# Patient Record
Sex: Female | Born: 1975 | Hispanic: Yes | Marital: Married | Smoking: Never smoker
Health system: Southern US, Community
[De-identification: ages and names within clinical notes are randomized; demographics above are authoritative.]

## PROBLEM LIST (undated history)

## (undated) ENCOUNTER — Inpatient Hospital Stay (HOSPITAL_COMMUNITY): Payer: Self-pay

## (undated) DIAGNOSIS — R51 Headache: Secondary | ICD-10-CM

## (undated) DIAGNOSIS — R519 Headache, unspecified: Secondary | ICD-10-CM

## (undated) DIAGNOSIS — O24419 Gestational diabetes mellitus in pregnancy, unspecified control: Secondary | ICD-10-CM

## (undated) DIAGNOSIS — N12 Tubulo-interstitial nephritis, not specified as acute or chronic: Secondary | ICD-10-CM

## (undated) HISTORY — PX: CHOLECYSTECTOMY: SHX55

## (undated) HISTORY — DX: Tubulo-interstitial nephritis, not specified as acute or chronic: N12

---

## 1994-07-18 DIAGNOSIS — N12 Tubulo-interstitial nephritis, not specified as acute or chronic: Secondary | ICD-10-CM

## 1994-07-18 HISTORY — DX: Tubulo-interstitial nephritis, not specified as acute or chronic: N12

## 2008-11-26 ENCOUNTER — Emergency Department (HOSPITAL_COMMUNITY): Admission: EM | Admit: 2008-11-26 | Discharge: 2008-11-26 | Payer: Self-pay | Admitting: Emergency Medicine

## 2010-10-26 LAB — DIFFERENTIAL
Basophils Absolute: 0 10*3/uL (ref 0.0–0.1)
Basophils Absolute: 0 10*3/uL (ref 0.0–0.1)
Basophils Relative: 0 % (ref 0–1)
Basophils Relative: 0 % (ref 0–1)
Eosinophils Absolute: 0.2 10*3/uL (ref 0.0–0.7)
Eosinophils Absolute: 0.2 10*3/uL (ref 0.0–0.7)
Eosinophils Relative: 3 % (ref 0–5)
Eosinophils Relative: 4 % (ref 0–5)
Lymphocytes Relative: 17 % (ref 12–46)
Lymphocytes Relative: 24 % (ref 12–46)
Lymphs Abs: 1 10*3/uL (ref 0.7–4.0)
Lymphs Abs: 1.2 10*3/uL (ref 0.7–4.0)
Monocytes Absolute: 0.4 10*3/uL (ref 0.1–1.0)
Monocytes Absolute: 0.6 10*3/uL (ref 0.1–1.0)
Monocytes Relative: 11 % (ref 3–12)
Monocytes Relative: 7 % (ref 3–12)
Neutro Abs: 3.2 10*3/uL (ref 1.7–7.7)
Neutro Abs: 4 10*3/uL (ref 1.7–7.7)
Neutrophils Relative %: 61 % (ref 43–77)
Neutrophils Relative %: 73 % (ref 43–77)

## 2010-10-26 LAB — CBC
HCT: 27.7 % — ABNORMAL LOW (ref 36.0–46.0)
HCT: 28.9 % — ABNORMAL LOW (ref 36.0–46.0)
Hemoglobin: 9.1 g/dL — ABNORMAL LOW (ref 12.0–15.0)
Hemoglobin: 9.2 g/dL — ABNORMAL LOW (ref 12.0–15.0)
MCHC: 31.9 g/dL (ref 30.0–36.0)
MCHC: 33 g/dL (ref 30.0–36.0)
MCV: 65.9 fL — ABNORMAL LOW (ref 78.0–100.0)
MCV: 66.3 fL — ABNORMAL LOW (ref 78.0–100.0)
Platelets: 211 10*3/uL (ref 150–400)
Platelets: 220 10*3/uL (ref 150–400)
RBC: 4.2 MIL/uL (ref 3.87–5.11)
RBC: 4.37 MIL/uL (ref 3.87–5.11)
RDW: 19.5 % — ABNORMAL HIGH (ref 11.5–15.5)
RDW: 19.5 % — ABNORMAL HIGH (ref 11.5–15.5)
WBC: 5.2 10*3/uL (ref 4.0–10.5)
WBC: 5.6 10*3/uL (ref 4.0–10.5)

## 2010-10-26 LAB — POCT I-STAT, CHEM 8
BUN: 13 mg/dL (ref 6–23)
Calcium, Ion: 1.15 mmol/L (ref 1.12–1.32)
Chloride: 105 mEq/L (ref 96–112)
Creatinine, Ser: 0.7 mg/dL (ref 0.4–1.2)
Glucose, Bld: 100 mg/dL — ABNORMAL HIGH (ref 70–99)
HCT: 31 % — ABNORMAL LOW (ref 36.0–46.0)
Hemoglobin: 10.5 g/dL — ABNORMAL LOW (ref 12.0–15.0)
Potassium: 4.1 mEq/L (ref 3.5–5.1)
Sodium: 137 mEq/L (ref 135–145)
TCO2: 23 mmol/L (ref 0–100)

## 2010-10-26 LAB — URINALYSIS, ROUTINE W REFLEX MICROSCOPIC
Bilirubin Urine: NEGATIVE
Glucose, UA: NEGATIVE mg/dL
Hgb urine dipstick: NEGATIVE
Ketones, ur: NEGATIVE mg/dL
Nitrite: NEGATIVE
Protein, ur: NEGATIVE mg/dL
Specific Gravity, Urine: 1.026 (ref 1.005–1.030)
Urobilinogen, UA: 0.2 mg/dL (ref 0.0–1.0)
pH: 5.5 (ref 5.0–8.0)

## 2010-10-26 LAB — GC/CHLAMYDIA PROBE AMP, GENITAL
Chlamydia, DNA Probe: NEGATIVE
GC Probe Amp, Genital: NEGATIVE

## 2010-10-26 LAB — POCT PREGNANCY, URINE: Preg Test, Ur: NEGATIVE

## 2010-10-26 LAB — WET PREP, GENITAL
Clue Cells Wet Prep HPF POC: NONE SEEN
Trich, Wet Prep: NONE SEEN
Yeast Wet Prep HPF POC: NONE SEEN

## 2010-10-26 LAB — RPR: RPR Ser Ql: NONREACTIVE

## 2011-07-19 DIAGNOSIS — O24419 Gestational diabetes mellitus in pregnancy, unspecified control: Secondary | ICD-10-CM

## 2011-07-19 HISTORY — DX: Gestational diabetes mellitus in pregnancy, unspecified control: O24.419

## 2012-01-24 ENCOUNTER — Ambulatory Visit (INDEPENDENT_AMBULATORY_CARE_PROVIDER_SITE_OTHER): Payer: Self-pay | Admitting: Family Medicine

## 2012-01-24 ENCOUNTER — Encounter: Payer: Self-pay | Admitting: Family Medicine

## 2012-01-24 VITALS — BP 118/76 | HR 69 | Ht 63.0 in | Wt 156.0 lb

## 2012-01-24 DIAGNOSIS — IMO0001 Reserved for inherently not codable concepts without codable children: Secondary | ICD-10-CM

## 2012-01-24 DIAGNOSIS — Z309 Encounter for contraceptive management, unspecified: Secondary | ICD-10-CM

## 2012-01-24 LAB — POCT URINE PREGNANCY: Preg Test, Ur: NEGATIVE

## 2012-01-24 MED ORDER — NORGESTIMATE-ETH ESTRADIOL 0.25-35 MG-MCG PO TABS: 1.0000 | ORAL_TABLET | Freq: Every day | ORAL | Status: DC

## 2012-01-24 NOTE — Progress Notes (Signed)
S: Pt comes in today for new patient visit.  Will be applying for orange card.  Only concern for today is having IUD removed-- was place din 2008, has been having some difficulties with it for the past few months.  Would like to change to OCPs for birth control.  Wanda Powers snot been on them in many years and does not have a preference for which one to start.  Non-smoker, no migraine headaches, no blood pressure issues, no history of blood clots.    ROS: Per HPI  Past medical, surgical, social, and family histories reviewed and updates as above.  History  Smoking status  . Never Smoker   Smokeless tobacco  . Not on file    O:  Filed Vitals:   01/24/12 1105  BP: 118/76  Pulse: 69    Gen: NAD Pelvic: cervix normal in appearance without erythema or irritation, no vaginal discharge or bleeding, no external lesions; IUD strings easily identified.    A/P: 36 y.o. female p/w New patient, IUD removal -See problem list -f/u with Dr. Mauricio Po once meets with Rudell Cobb.     PROCEDURE NOTE: IUD REMOVAL Speculum was inserted in the vagina, cervix was well visualized.  Strings were easily identifiable protruding from cervix.  Strings were grasps with long ringed forceps.  IUD was removed in its entirety.  Patient tolerated the procedure well without pain or bleeding.  Patient was counseled on using different form of birth control.

## 2012-01-24 NOTE — Patient Instructions (Signed)
It was nice to meet you today.  We removed your IUD.  I am giving you a prescription for birth control pills.  They are $7 at [facility]!  Come back and see Dr. Mauricio Po once you have applied for the orange card.

## 2012-01-24 NOTE — Assessment & Plan Note (Signed)
IUD removed without difficulty.  OCPs stated per pt request-- since no preference, will start Sprintec due to low cost.

## 2012-02-21 ENCOUNTER — Ambulatory Visit: Payer: Self-pay | Admitting: Family Medicine

## 2012-02-24 ENCOUNTER — Encounter: Payer: Self-pay | Admitting: Family Medicine

## 2012-02-24 ENCOUNTER — Ambulatory Visit (INDEPENDENT_AMBULATORY_CARE_PROVIDER_SITE_OTHER): Payer: Self-pay | Admitting: Family Medicine

## 2012-02-24 VITALS — BP 105/70 | HR 66 | Ht 63.0 in | Wt 158.0 lb

## 2012-02-24 DIAGNOSIS — M7710 Lateral epicondylitis, unspecified elbow: Secondary | ICD-10-CM

## 2012-02-24 DIAGNOSIS — Z309 Encounter for contraceptive management, unspecified: Secondary | ICD-10-CM

## 2012-02-24 DIAGNOSIS — M7711 Lateral epicondylitis, right elbow: Secondary | ICD-10-CM

## 2012-02-24 MED ORDER — NAPROXEN 500 MG PO TABS: 500.0000 mg | ORAL_TABLET | Freq: Two times a day (BID) | ORAL | Status: DC

## 2012-02-24 NOTE — Progress Notes (Signed)
  Subjective:    Patient ID: Wanda Powers, female    DOB: 01-Aug-1975, 36 y.o.   MRN: [id number]  HPI Visit conducted in Spanish.   Patient with complaint of R elbow pain; point tenderness over the R lateral epicondyle.  Has been ongoing for the past several (@6 ) months. She is right-handed. Does not identify any changes in her activity that would have triggered this.  No history of trauma to the right elbow.   Has not taken anything to help with the pain.     Review of Systems     Objective:   Physical Exam Well appearing, no apparent distress HEENT Neck supple. MSK: R elbow full active and passive ROM; point tenderness over R lateral epicondyle. No redness or warmth, no edema. Sensation in fingers full bilaterally. Palpable radial pulses bilat. Negative tinnels and phalens signs bilat.  Full active/passive ROM shoulders bilaterally.        Assessment & Plan:

## 2012-02-24 NOTE — Assessment & Plan Note (Signed)
Taking the Sprintec that was prescribed at last visit with IUD removal. Tolerating well. To continue.

## 2012-02-24 NOTE — Patient Instructions (Addendum)
Fue un Research officer, trade union.   Para el dolor del codo derecho, recomiendo que lo envuelva en una toalla para que no se doble cuando duerme, cada noche. De dia, quiero que use un cinto que se llama de TENNIS ELBOW STRAP.  Se puede comprar sin receta en la farmacia.  Tome la Naproxen 500mg , una tableta por boca, 2 veces por dia, con algo de comer.  Despues de 15 dias, tomesela nada mas cuando lo necesite para el dolor.

## 2012-02-24 NOTE — Assessment & Plan Note (Signed)
Discussed limitation of possible exacerbating activity.  Use bath towel around R elbow when sleeping.  Use of tennis elbow strap during the day. NSAIDs for the coming 2 weeks with Naproxen 500mg  twice daily with food.  Follow up if not better in the coming 6 weeks.

## 2012-04-03 ENCOUNTER — Ambulatory Visit (INDEPENDENT_AMBULATORY_CARE_PROVIDER_SITE_OTHER): Payer: Self-pay | Admitting: Family Medicine

## 2012-04-03 ENCOUNTER — Ambulatory Visit (HOSPITAL_COMMUNITY)
Admission: RE | Admit: 2012-04-03 | Discharge: 2012-04-03 | Disposition: A | Discharge status: Home / Self Care | Payer: Self-pay | Source: Ambulatory Visit | Attending: Family Medicine | Admitting: Family Medicine

## 2012-04-03 ENCOUNTER — Encounter: Payer: Self-pay | Admitting: Family Medicine

## 2012-04-03 VITALS — BP 107/71 | HR 92 | Temp 98.4°F | Ht 63.0 in | Wt 163.0 lb

## 2012-04-03 DIAGNOSIS — M7710 Lateral epicondylitis, unspecified elbow: Secondary | ICD-10-CM | POA: Insufficient documentation

## 2012-04-03 DIAGNOSIS — L0201 Cutaneous abscess of face: Secondary | ICD-10-CM

## 2012-04-03 DIAGNOSIS — M7711 Lateral epicondylitis, right elbow: Secondary | ICD-10-CM

## 2012-04-03 DIAGNOSIS — L03211 Cellulitis of face: Secondary | ICD-10-CM | POA: Insufficient documentation

## 2012-04-03 MED ORDER — MUPIROCIN 2 % EX OINT: TOPICAL_OINTMENT | Freq: Two times a day (BID) | CUTANEOUS | Status: DC

## 2012-04-03 MED ORDER — NAPROXEN 500 MG PO TABS: 500.0000 mg | ORAL_TABLET | Freq: Two times a day (BID) | ORAL | Status: DC

## 2012-04-03 NOTE — Patient Instructions (Addendum)
Fue un Research officer, trade union.  Creo que el dolor del codo derecho se debe a una condicion que se llama de LATERAL EPICONDYLITIS; es un proceso inflamatorio.  Ademas de las cosas que ha estado haciendo para esto, recomiendo que use un cinto que se llama de "TENNIS ELBOW STRAP", que se aplica alrededor del antebrazo un poco debajo del codo derecho.  Trate de evitar actividades que requieren de mucho esfuerzo para levantar cosas pesadas con el brazo derecho.  Voy a Research scientist (physical sciences) radiografia del codo derecho, para excluir otros procesos que pudieran Software engineer.   Quiero verle en 4 semanas si no esta' mejor.   PATIENT'S MOTHER, MARIA DE [place] HERNANDEZ, TO BE REGISTERED FOR DR Mauricio Po.  PLEASE GIVE MOTHER AN APPOINTMENT WITH DR Mauricio Po ON 9/20.

## 2012-04-04 ENCOUNTER — Encounter: Payer: Self-pay | Admitting: Family Medicine

## 2012-04-04 NOTE — Assessment & Plan Note (Signed)
Very mild erythema around the nasal alae bilaterally, spreading slightly above philtrum. For topical mupirocin use twice daily until it goes away.

## 2012-04-04 NOTE — Progress Notes (Signed)
  Subjective:    Patient ID: Wanda Powers, female    DOB: 11-18-1975, 36 y.o.   MRN: [id number]  HPI Patient visit conducted in Spanish.  She presents today with her mother, Jackelyn Knife [place], who recently arrived from [place], [place], and would like to establish as a patient here.   Teirra reports that her R elbow continues to bother her, especially when she lifts something heavy.  No trauma that she can recall.  When I ask about Naproxen, she states she is not taking because she ran out.  Also, has a "blue" sleeve that she puts on above the level of the right elbow.  Has been using the bath towel at night to present prolonged periods of flexion.  She does housework but is not working outside the home.  She does not drive or rest her R elbow in stationary position for prolonged time.  She describes some radiating discomfort into the R 4 and 5th digits at times.  The lateral epicondylar pain radiates proximally at times, and she has some difficulty raising her R shoulder because of it.  She is right-handed.     Review of SystemsSee above     Objective:   Physical Exam Well appearing, no apparent distress HEENT Neck supple. Full active ROM in all planes. Raw and erythematous appearance of skin around both nasal allae, extending to superior margin of philtrum. MSK: Small amount tenderness over AC joint on Right.  FUll active ROM in R shoulder includilng flexion/extension, int/ext rotation, ab/adduction. Handgrip intact and full bilat, symmetric. Radial pulses present and symmetric bilaterally. Sensation grossly intact bilaterally in fingertips.  Point tenderness over the R lateral epicondyle; no tenderness when pressing on the ulnar groove on the Right.        Assessment & Plan:

## 2012-04-04 NOTE — Assessment & Plan Note (Signed)
Continues to describe pain over lateral epicondyle of R arm; she is right-handed.  Has not been using Naproxen regularly (ran out), and has been using what sounds like blue neopryne sleeve proximal to the R elbow.  Discussed use of tennis elbow strap distal to the elbow; will get xrays to rule out bony fragments or other occult injury.  She does not report any notable trauma or accidents, but she states that she cannot exclude the possibility of some subtle injury in the past.  To consider injection of epicondylitis if xray negative and if pain continues despite NSAID and tennis elbow strap usage.

## 2012-04-17 ENCOUNTER — Ambulatory Visit: Payer: Self-pay | Admitting: Family Medicine

## 2012-04-24 ENCOUNTER — Ambulatory Visit (INDEPENDENT_AMBULATORY_CARE_PROVIDER_SITE_OTHER): Payer: Self-pay | Admitting: Family Medicine

## 2012-04-24 ENCOUNTER — Encounter: Payer: Self-pay | Admitting: Family Medicine

## 2012-04-24 VITALS — BP 118/82 | HR 69 | Ht 63.0 in | Wt 161.9 lb

## 2012-04-24 DIAGNOSIS — M7711 Lateral epicondylitis, right elbow: Secondary | ICD-10-CM

## 2012-04-24 DIAGNOSIS — Z3201 Encounter for pregnancy test, result positive: Secondary | ICD-10-CM

## 2012-04-24 DIAGNOSIS — Z309 Encounter for contraceptive management, unspecified: Secondary | ICD-10-CM

## 2012-04-24 DIAGNOSIS — Z3480 Encounter for supervision of other normal pregnancy, unspecified trimester: Secondary | ICD-10-CM

## 2012-04-24 DIAGNOSIS — M7710 Lateral epicondylitis, unspecified elbow: Secondary | ICD-10-CM

## 2012-04-24 LAB — POCT URINE PREGNANCY: Preg Test, Ur: POSITIVE

## 2012-04-24 MED ORDER — NITROGLYCERIN 0.1 MG/HR TD PT24: 1.0000 | MEDICATED_PATCH | Freq: Every day | TRANSDERMAL | Status: DC

## 2012-04-24 NOTE — Patient Instructions (Addendum)
Fue un Research officer, trade union.   Para el dolor del codo derecho, si no esta' mejor, quiero que vea a los medicos de Sports Medicine (medicina de deporte/musculoesqueletal).  La prueba de embarazo salio' positiva hoy.  Puede pasar en los casos cuando se inician las pastillas anticonceptivas.  Le recomiendo que comience a tomar las vitaminas prenatales una diario.  Por la ultima menstruacion tiene ahora 5 semanas con 4 dias, con una fecha de parto anticipado de 06 de junio de 2014.  Le estoy remitiendole al programa [facility] [address] de [place].

## 2012-04-25 NOTE — Assessment & Plan Note (Signed)
By LMP, patient's dating is [redacted]w[redacted]d today.  She is here with her mother; she was not planning to have another child at this time; states clearly that she would not interrupt or abort the pregnancy.  Is given contact information for AAM program, and instructed in Prenatal vitamin use.  Will enroll for prenatal care in our office once she qualifies for AAM.  She understands this protocol.

## 2012-04-25 NOTE — Assessment & Plan Note (Signed)
Patient with lateral epicondylitis, Right elbow.  I had planned and discussed use of NTG patch q24 hours, however in light of her pregnancy, we will not pursue this plan.  Patient is to make appt with [facility] for consideration [place] and possible injection at the discretion of the [facility] doctors.  No NSAIDs.   I called patient after visit and on Oct 09 to clarify and reiterate these points; left 2 voice mails for her to call me to discuss.

## 2012-04-25 NOTE — Progress Notes (Signed)
  Subjective:    Patient ID: Wanda Powers, female    DOB: June 10, 1976, 36 y.o.   MRN: [id number]  HPI Visit in Spanish.  Kordelia reports that her R elbow pain is continuing, despite use of NSAIDs and tennis elbow brace.  Worse when she lifts things.  Is becoming frustrated.   Reviewed history; no acute trauma preceded this episode of injury/pain.    Of note, her LMP is Mar 16, 2012.  She had IUD removed in early July in this office (see office notes); was using OCPs before the removal of the Mirena IUD (which she had originally placed 10 yrs ago, replaced 5 yrs ago).  Has been taking OCPs daily since the removal of the IUD.  She and husband had talked about having another child in the future, but were not planning for it now.    Review of SystemsSee above. She denies nausea/vomiting or breast tenderness.      Objective:   Physical Exam Well appearing, no apparent distress.  ELBOW, RIGHT with point tenderness over lateral epicondyle. Full handgrip and ROM (Active) in R elbow.       Assessment & Plan:

## 2012-04-26 ENCOUNTER — Telehealth: Payer: Self-pay | Admitting: Family Medicine

## 2012-04-26 NOTE — Telephone Encounter (Signed)
Call completed to patient, to let her know that I do not recommend using the NTG patch for her elbow epicondylitis while pregnant.  I had called the pharmacy before the med was dispensed and I cancelled the Rx, so she did not receive the medicine.  I am asking our staff ([facility]) to call patient to schedule a Sports Medicine appt, to consider injection or [place] of epicondylitis in this pregnant patient.   I gave her the contact info (address and phone #) of [facility] for registration with them and eventual assignment to our practice for prenatal care.  [name], MD

## 2012-04-26 NOTE — Telephone Encounter (Signed)
Pt is returning call to Dr Mauricio Po - she will be available this afternoon

## 2012-04-26 NOTE — Telephone Encounter (Signed)
Forward to Dr Mauricio Po, I don't see any phone note in the chart?

## 2012-05-08 ENCOUNTER — Telehealth: Payer: Self-pay | Admitting: Family Medicine

## 2012-05-08 NOTE — Telephone Encounter (Signed)
Patient is calling to find out what is a good medication to take for a cough and cold while pregnant.

## 2012-05-08 NOTE — Telephone Encounter (Signed)
Called pt. Advised to take Robitussin DM, Sudafed for congestion and also to ask the pharmacist for advise re: safe cough meds during pregnancy. Pt agreed. Lorenda Hatchet, Renato Battles

## 2012-05-09 NOTE — Telephone Encounter (Signed)
I called patient, call in Spanish.  She is feeling a bit better from the cold, taking teas, no otc medicines.  I called to let her know that Sudafed is not recommended early in pregnancy (she is in 1st trimester).  She has appt with [facility] tomorrow.  [name], MD

## 2012-05-22 ENCOUNTER — Other Ambulatory Visit: Payer: Self-pay

## 2012-05-22 DIAGNOSIS — Z3480 Encounter for supervision of other normal pregnancy, unspecified trimester: Secondary | ICD-10-CM

## 2012-05-22 LAB — HIV ANTIBODY (ROUTINE TESTING W REFLEX): HIV: NONREACTIVE

## 2012-05-22 NOTE — Progress Notes (Signed)
PRENATAL LABS DONE TODAY Wanda Powers 

## 2012-05-23 LAB — OBSTETRIC PANEL
Antibody Screen: NEGATIVE
Basophils Absolute: 0 10*3/uL (ref 0.0–0.1)
Basophils Relative: 0 % (ref 0–1)
Eosinophils Absolute: 0.1 10*3/uL (ref 0.0–0.7)
Eosinophils Relative: 3 % (ref 0–5)
HCT: 31.2 % — ABNORMAL LOW (ref 36.0–46.0)
Hemoglobin: 10 g/dL — ABNORMAL LOW (ref 12.0–15.0)
Hepatitis B Surface Ag: NEGATIVE
Lymphocytes Relative: 23 % (ref 12–46)
Lymphs Abs: 1.3 10*3/uL (ref 0.7–4.0)
MCH: 22.1 pg — ABNORMAL LOW (ref 26.0–34.0)
MCHC: 32.1 g/dL (ref 30.0–36.0)
MCV: 68.9 fL — ABNORMAL LOW (ref 78.0–100.0)
Monocytes Absolute: 0.5 10*3/uL (ref 0.1–1.0)
Monocytes Relative: 8 % (ref 3–12)
Neutro Abs: 3.6 10*3/uL (ref 1.7–7.7)
Neutrophils Relative %: 66 % (ref 43–77)
Platelets: 262 10*3/uL (ref 150–400)
RBC: 4.53 MIL/uL (ref 3.87–5.11)
RDW: 20.7 % — ABNORMAL HIGH (ref 11.5–15.5)
Rh Type: POSITIVE
Rubella: 17.5 IU/mL — ABNORMAL HIGH
WBC: 5.5 10*3/uL (ref 4.0–10.5)

## 2012-05-23 LAB — SICKLE CELL SCREEN: Sickle Cell Screen: NEGATIVE

## 2012-05-24 ENCOUNTER — Telehealth: Payer: Self-pay | Admitting: Family Medicine

## 2012-05-24 ENCOUNTER — Encounter: Payer: Self-pay | Admitting: Family Medicine

## 2012-05-24 ENCOUNTER — Ambulatory Visit (INDEPENDENT_AMBULATORY_CARE_PROVIDER_SITE_OTHER): Payer: Self-pay | Admitting: Family Medicine

## 2012-05-24 ENCOUNTER — Other Ambulatory Visit: Payer: Self-pay | Admitting: Family Medicine

## 2012-05-24 VITALS — BP 125/77 | HR 91 | Ht 63.0 in | Wt 169.0 lb

## 2012-05-24 DIAGNOSIS — M7711 Lateral epicondylitis, right elbow: Secondary | ICD-10-CM

## 2012-05-24 DIAGNOSIS — M7710 Lateral epicondylitis, unspecified elbow: Secondary | ICD-10-CM

## 2012-05-24 LAB — CULTURE, OB URINE
Colony Count: NO GROWTH
Organism ID, Bacteria: NO GROWTH

## 2012-05-24 MED ORDER — FERROUS SULFATE 325 (65 FE) MG PO TABS: 325.0000 mg | ORAL_TABLET | Freq: Every day | ORAL | Status: DC

## 2012-05-24 MED ORDER — MUPIROCIN CALCIUM 2 % EX CREA: TOPICAL_CREAM | Freq: Three times a day (TID) | CUTANEOUS | Status: DC

## 2012-05-24 MED ORDER — FLUTICASONE PROPIONATE 50 MCG/ACT NA SUSP: 2.0000 | Freq: Every day | NASAL | Status: DC

## 2012-05-24 NOTE — Patient Instructions (Addendum)
You have lateral epicondylitis (tennis elbow). Try to avoid painful activities as much as possible. Ice the area 3-4 times a day for 15 minutes at a time. Tylenol as needed for pain. Counterforce brace as directed can help unload area - wear this regularly if it provides you with relief. Start physical therapy and do home exercises they give you on days you do not go there. Home Pronation/supination with 1 pound weight, wrist extension, stretching - do these once a day. At follow-up will consider injection if the above is not helping. Surgery is a consideration if exercises and injection are not providing enough relief. Follow up with me in 5-6 weeks.

## 2012-05-24 NOTE — Telephone Encounter (Signed)
Called Wanda Powers regarding her prenatal labs, she has Hgb 10.  I am recommending her to take FeSO4 325 one time daily.  She is taking PNVs now.   Also, she states the mupirocin that I prescribed in Sept for suspected early soft tissue infection around nasal alae was too expensive (over $80).  Will send to [facility] MAP program by fax.  She reports some continued dry cough with throat itch, without sputum production.  Consider nasal saline and nasal steroid spray to see if this helps.  Will send along to MAP as well. Discussed with her the reason for her being scheduled with another physician (Dr. Clinton Sawyer) for her OB visits.  She voices understanding of this.  [name], MD

## 2012-05-24 NOTE — Progress Notes (Signed)
  Subjective:    Patient ID: Wanda Powers, female    DOB: Aug 06, 1975, 36 y.o.   MRN: [id number]  PCP: Dr. Mauricio Po  HPI 36 yo F here for right elbow pain.  Patient denies known injury. States about 10 months ago started to develop lateral elbow pain. No swelling or bruising. Radiates into forearm. Worse with wrist motions. No numbness or tingling. Tried naproxen, elbow brace without much help. Not taking any medicine for this now. Has not done rehab or had injection.  History reviewed. No pertinent past medical history.  Current Outpatient Prescriptions on File Prior to Visit  Medication Sig Dispense Refill  . ferrous sulfate 325 (65 FE) MG tablet Take 1 tablet (325 mg total) by mouth daily with breakfast.  90 tablet  3  . fluticasone (FLONASE) 50 MCG/ACT nasal spray Place 2 sprays into the nose daily.  16 g  2  . mupirocin cream (BACTROBAN) 2 % Apply topically 3 (three) times daily.  15 g  0  . [DISCONTINUED] ferrous sulfate 325 (65 FE) MG tablet Take 1 tablet (325 mg total) by mouth daily with breakfast.  90 tablet  3  . [DISCONTINUED] fluticasone (FLONASE) 50 MCG/ACT nasal spray Place 2 sprays into the nose daily.  16 g  2    Past Surgical History  Procedure Date  . Cesarean section     No Known Allergies  History   Social History  . Marital Status: Unknown    Spouse Name: N/A    Number of Children: N/A  . Years of Education: N/A   Occupational History  . Not on file.   Social History Main Topics  . Smoking status: Never Smoker   . Smokeless tobacco: Not on file  . Alcohol Use: No  . Drug Use: No  . Sexually Active: Yes    Birth Control/ Protection: IUD     Comment: now OCPs   Other Topics Concern  . Not on file   Social History Narrative  . No narrative on file    Family History  Problem Relation Age of Onset  . Hyperlipidemia Mother   . Hypertension Mother   . Hypertension Sister   . Heart attack Neg Hx   . Diabetes Neg Hx   . Sudden death Neg  Hx     BP 125/77  Pulse 91  Ht 5\' 3"  (1.6 m)  Wt 169 lb (76.658 kg)  BMI 29.94 kg/m2  Review of Systems See HPI above.    Objective:   Physical Exam Gen: NAD  R elbow: No gross deformity, swelling, bruising. TTP lateral epicondyle with less in extensor mass distal to this.  No other elbow, wrist TTP. FROM. Pain with resisted wrist and 3rd finger extension reproducing pain.  Collateral ligaments intact. NVI distally.    Assessment & Plan:  1. Right elbow pain - 2/2 lateral epicondylitis.  Discussed options.  Will start PT and HEP (has orange card 100% coverage).  Icing, tylenol as needed for pain. No help with counterforce brace.  Discussed cortisone shot and she would like to try PT/HEP first which I agree with.  F/u in 5-6 weeks for reevaluation - consider injection at that time if not improving as expected.

## 2012-05-24 NOTE — Assessment & Plan Note (Signed)
Discussed options.  Will start PT and HEP (has orange card 100% coverage).  Icing, tylenol as needed for pain. No help with counterforce brace.  Discussed cortisone shot and she would like to try PT/HEP first which I agree with.  F/u in 5-6 weeks for reevaluation - consider injection at that time if not improving as expected.

## 2012-05-24 NOTE — Telephone Encounter (Signed)
Message copied by Barbaraann Barthel on Thu May 24, 2012  9:08 AM ------      Message from: Zachery Dauer      Created: Tue May 22, 2012  6:15 PM                   ----- Message -----         From: [facility] Interface         Sent: 05/22/2012   5:26 PM           To: Tobin Chad, MD

## 2012-05-29 ENCOUNTER — Telehealth: Payer: Self-pay | Admitting: Family Medicine

## 2012-05-29 ENCOUNTER — Other Ambulatory Visit (HOSPITAL_COMMUNITY)
Admission: RE | Admit: 2012-05-29 | Discharge: 2012-05-29 | Disposition: A | Discharge status: Home / Self Care | Payer: Self-pay | Source: Ambulatory Visit | Attending: Family Medicine | Admitting: Family Medicine

## 2012-05-29 ENCOUNTER — Encounter: Payer: Self-pay | Admitting: Family Medicine

## 2012-05-29 ENCOUNTER — Ambulatory Visit (INDEPENDENT_AMBULATORY_CARE_PROVIDER_SITE_OTHER): Payer: No Typology Code available for payment source | Admitting: Family Medicine

## 2012-05-29 VITALS — BP 120/75 | Wt 167.0 lb

## 2012-05-29 DIAGNOSIS — Z3480 Encounter for supervision of other normal pregnancy, unspecified trimester: Secondary | ICD-10-CM

## 2012-05-29 DIAGNOSIS — N760 Acute vaginitis: Secondary | ICD-10-CM

## 2012-05-29 DIAGNOSIS — Z1151 Encounter for screening for human papillomavirus (HPV): Secondary | ICD-10-CM | POA: Insufficient documentation

## 2012-05-29 DIAGNOSIS — Z01419 Encounter for gynecological examination (general) (routine) without abnormal findings: Secondary | ICD-10-CM | POA: Insufficient documentation

## 2012-05-29 DIAGNOSIS — B9689 Other specified bacterial agents as the cause of diseases classified elsewhere: Secondary | ICD-10-CM

## 2012-05-29 DIAGNOSIS — Z23 Encounter for immunization: Secondary | ICD-10-CM

## 2012-05-29 DIAGNOSIS — N898 Other specified noninflammatory disorders of vagina: Secondary | ICD-10-CM

## 2012-05-29 DIAGNOSIS — R87810 Cervical high risk human papillomavirus (HPV) DNA test positive: Secondary | ICD-10-CM | POA: Insufficient documentation

## 2012-05-29 LAB — POCT WET PREP (WET MOUNT): Clue Cells Wet Prep Whiff POC: POSITIVE

## 2012-05-29 MED ORDER — METRONIDAZOLE 500 MG PO TABS: 500.0000 mg | ORAL_TABLET | Freq: Three times a day (TID) | ORAL | Status: DC

## 2012-05-29 MED ORDER — METRONIDAZOLE 500 MG PO TABS: 500.0000 mg | ORAL_TABLET | Freq: Two times a day (BID) | ORAL | Status: AC

## 2012-05-29 NOTE — Telephone Encounter (Signed)
Patient called to tell her that tests show BV. She request Rx sent to [facility].

## 2012-05-29 NOTE — Progress Notes (Signed)
36 year old G29P3003 @ 10w and 4d by LMP who presents for her initial prenatal visit. Patient says this is an unintended pregnancy. She also presents with vaginal discharge. It is not associated with burning or pain. She has only one sexual partner which is her husband.   O BP 120/75  Wt 167 lb (75.751 kg)  LMP 03/16/2012  Gen: well appearing Hispanic female, pleasant and conversant  CV: RRR, no murmur  Lungs: CTA-B  Abd: gravid, NDNT, NABS  Pelvic: no lesion, normal external genitalia, minimal white vaginal discharge, healthy vaginal mucosa, cervix non-friable, no CTM  A/P: 36 year old G57P3003 @ 10w 4d from LMP - AMA: Quad screen @ 16-18 weeks - Taking Prenatal vitamins - Microcytic Anemia - patient taking FeSO4 - Obesity - obtain early glucola - BV - treat with flagyl - F/u in 4 weeks - MOD: patient would like TOLAC - will need counseling around 30th week at

## 2012-05-30 ENCOUNTER — Other Ambulatory Visit: Payer: Self-pay

## 2012-05-31 ENCOUNTER — Ambulatory Visit: Payer: Self-pay | Attending: Family Medicine | Admitting: Physical Therapy

## 2012-05-31 ENCOUNTER — Other Ambulatory Visit: Payer: No Typology Code available for payment source

## 2012-05-31 ENCOUNTER — Telehealth: Payer: Self-pay | Admitting: Family Medicine

## 2012-05-31 DIAGNOSIS — R293 Abnormal posture: Secondary | ICD-10-CM | POA: Insufficient documentation

## 2012-05-31 DIAGNOSIS — M25519 Pain in unspecified shoulder: Secondary | ICD-10-CM | POA: Insufficient documentation

## 2012-05-31 DIAGNOSIS — IMO0001 Reserved for inherently not codable concepts without codable children: Secondary | ICD-10-CM | POA: Insufficient documentation

## 2012-05-31 DIAGNOSIS — M25539 Pain in unspecified wrist: Secondary | ICD-10-CM | POA: Insufficient documentation

## 2012-05-31 LAB — GLUCOSE, CAPILLARY
Comment 1: 1
Glucose-Capillary: 184 mg/dL — ABNORMAL HIGH (ref 70–99)

## 2012-05-31 NOTE — Progress Notes (Signed)
1 HR GTT DONE TODAY Wanda Powers 

## 2012-05-31 NOTE — Telephone Encounter (Signed)
Will fwd to Dr.Breen

## 2012-05-31 NOTE — Telephone Encounter (Signed)
Patient would like to speak to her doctor about the GTT she had this morning.  She prefers to speak to Dr. Mauricio Po because he speaks more Spanish.

## 2012-06-01 NOTE — Telephone Encounter (Signed)
Will fwd. To Dr.Williamson to address. Thank you. Lorenda Hatchet, Renato Battles

## 2012-06-01 NOTE — Telephone Encounter (Signed)
I tried to return patient's call, left voicemail in [place].   Dr. Clinton Sawyer may receive communications related to Wanda Powers's care during her pregnancy, for continuity delivery.   [name], MD

## 2012-06-04 ENCOUNTER — Other Ambulatory Visit: Payer: No Typology Code available for payment source

## 2012-06-04 DIAGNOSIS — Z3480 Encounter for supervision of other normal pregnancy, unspecified trimester: Secondary | ICD-10-CM

## 2012-06-04 LAB — GLUCOSE, CAPILLARY: Glucose-Capillary: 102 mg/dL — ABNORMAL HIGH (ref 70–99)

## 2012-06-04 NOTE — Progress Notes (Signed)
3 HR GTT DONE TODAY Cortlynn Hollinsworth 

## 2012-06-05 LAB — GLUCOSE TOLERANCE, 3 HOURS
Glucose Tolerance, 1 hour: 159 mg/dL (ref 70–189)
Glucose Tolerance, 2 hour: 188 mg/dL — ABNORMAL HIGH (ref 70–164)
Glucose Tolerance, Fasting: 91 mg/dL (ref 70–104)
Glucose, GTT - 3 Hour: 154 mg/dL — ABNORMAL HIGH (ref 70–144)

## 2012-06-06 ENCOUNTER — Ambulatory Visit: Payer: Self-pay | Admitting: Physical Therapy

## 2012-06-06 ENCOUNTER — Telehealth: Payer: Self-pay | Admitting: Family Medicine

## 2012-06-06 DIAGNOSIS — O24419 Gestational diabetes mellitus in pregnancy, unspecified control: Secondary | ICD-10-CM

## 2012-06-06 NOTE — Telephone Encounter (Signed)
Has this been resolved?

## 2012-06-06 NOTE — Addendum Note (Signed)
Addended by: Damita Lack on: 06/06/2012 02:17 PM   Modules accepted: Orders

## 2012-06-06 NOTE — Telephone Encounter (Signed)
I called to inform her that [facility].

## 2012-06-07 NOTE — Telephone Encounter (Signed)
See separate phone note by Dr Clinton Sawyer regarding communication to patient about referral to [facility].  JB

## 2012-06-13 ENCOUNTER — Ambulatory Visit: Payer: Self-pay | Admitting: Rehabilitation

## 2012-06-15 ENCOUNTER — Encounter (HOSPITAL_COMMUNITY): Payer: Self-pay

## 2012-06-15 ENCOUNTER — Inpatient Hospital Stay (HOSPITAL_COMMUNITY)
Admission: AD | Admit: 2012-06-15 | Discharge: 2012-06-15 | Disposition: A | Discharge status: Home / Self Care | Payer: Self-pay | Source: Ambulatory Visit | Attending: Family Medicine | Admitting: Family Medicine

## 2012-06-15 DIAGNOSIS — M549 Dorsalgia, unspecified: Secondary | ICD-10-CM | POA: Insufficient documentation

## 2012-06-15 DIAGNOSIS — O99891 Other specified diseases and conditions complicating pregnancy: Secondary | ICD-10-CM | POA: Insufficient documentation

## 2012-06-15 DIAGNOSIS — R109 Unspecified abdominal pain: Secondary | ICD-10-CM | POA: Insufficient documentation

## 2012-06-15 DIAGNOSIS — M25559 Pain in unspecified hip: Secondary | ICD-10-CM | POA: Insufficient documentation

## 2012-06-15 DIAGNOSIS — O26899 Other specified pregnancy related conditions, unspecified trimester: Secondary | ICD-10-CM

## 2012-06-15 LAB — URINALYSIS, ROUTINE W REFLEX MICROSCOPIC
Bilirubin Urine: NEGATIVE
Glucose, UA: NEGATIVE mg/dL
Hgb urine dipstick: NEGATIVE
Ketones, ur: NEGATIVE mg/dL
Leukocytes, UA: NEGATIVE
Nitrite: NEGATIVE
Protein, ur: NEGATIVE mg/dL
Specific Gravity, Urine: 1.01 (ref 1.005–1.030)
Urobilinogen, UA: 0.2 mg/dL (ref 0.0–1.0)
pH: 5.5 (ref 5.0–8.0)

## 2012-06-15 LAB — WET PREP, GENITAL
Trich, Wet Prep: NONE SEEN
Yeast Wet Prep HPF POC: NONE SEEN

## 2012-06-15 MED ORDER — ACETAMINOPHEN 325 MG PO TABS
650.0000 mg | ORAL_TABLET | Freq: Once | ORAL | Status: AC
  Administered 2012-06-15: 650 mg via ORAL
  Filled 2012-06-15: qty 2

## 2012-06-15 NOTE — MAU Note (Signed)
Onset of right flank pain radiating around to abdomen since yesterday, no vaginal bleeding having discharge but no odor.

## 2012-06-15 NOTE — MAU Provider Note (Signed)
History     CSN: [id number]  Arrival date and time: 06/15/12 2217   First Provider Initiated Contact with Patient 06/15/12 2251      Chief Complaint  Patient presents with  . Back Pain   HPI Wanda Powers 36 y.o. [redacted]w[redacted]d   Comes to MAU with pain in right hip and flank.  Denies dysuria.  No other problems.  Has not tried any medication.  OB History    Grav Para Term Preterm Abortions TAB SAB Ect Mult Living   4 3 3       3       Past Medical History  Diagnosis Date  . Pyelonephritis 1996    Past Surgical History  Procedure Date  . Cesarean section     Family History  Problem Relation Age of Onset  . Hyperlipidemia Mother   . Hypertension Mother   . Cancer Mother   . Heart attack Neg Hx   . Diabetes Neg Hx   . Sudden death Neg Hx     History  Substance Use Topics  . Smoking status: Never Smoker   . Smokeless tobacco: Not on file  . Alcohol Use: No    Allergies: No Known Allergies  Prescriptions prior to admission  Medication Sig Dispense Refill  . ferrous sulfate 325 (65 FE) MG tablet Take 1 tablet (325 mg total) by mouth daily with breakfast.  90 tablet  3  . fluticasone (FLONASE) 50 MCG/ACT nasal spray Place 2 sprays into the nose daily.  16 g  2  . mupirocin cream (BACTROBAN) 2 % Apply topically 3 (three) times daily.  15 g  0    Review of Systems  Constitutional: Negative for fever.  Gastrointestinal: Negative for nausea, vomiting, abdominal pain, diarrhea and constipation.  Genitourinary:       No vaginal discharge. No vaginal bleeding. No dysuria.  Musculoskeletal: Positive for back pain.       Hip pain   Physical Exam   Blood pressure 134/77, pulse 82, temperature 98.7 F (37.1 C), resp. rate 16, height 5\' 3"  (1.6 m), weight 77.928 kg (171 lb 12.8 oz), last menstrual period 03/16/2012.  Physical Exam  Nursing note and vitals reviewed. Constitutional: She is oriented to person, place, and time. She appears well-developed and  well-nourished.  HENT:  Head: Normocephalic.  Eyes: EOM are normal.  Neck: Neck supple.  GI: Soft. There is no tenderness.  Genitourinary:       Speculum exam: Vulva - hypopigmentation noted in labia minora and in areas around rectum Vagina - minimal creamy discharge, no odor Cervix - No contact bleeding Bimanual exam: Cervix closed Uterus 13w size Adnexa non tender, no masses bilaterally GC/Chlam, wet prep done Chaperone present for exam.  Musculoskeletal: Normal range of motion.       Pain in right buttock.  Neurological: She is alert and oriented to person, place, and time.  Skin: Skin is warm and dry.  Psychiatric: She has a normal mood and affect.    MAU Course  Procedures Results for orders placed during the hospital encounter of 06/15/12 (from the past 24 hour(s))  URINALYSIS, ROUTINE W REFLEX MICROSCOPIC     Status: Normal   Collection Time   06/15/12 10:23 PM      Component Value Range   Color, Urine YELLOW  YELLOW   APPearance CLEAR  CLEAR   Specific Gravity, Urine 1.010  1.005 - 1.030   pH 5.5  5.0 - 8.0   Glucose, UA NEGATIVE  NEGATIVE mg/dL   Hgb urine dipstick NEGATIVE  NEGATIVE   Bilirubin Urine NEGATIVE  NEGATIVE   Ketones, ur NEGATIVE  NEGATIVE mg/dL   Protein, ur NEGATIVE  NEGATIVE mg/dL   Urobilinogen, UA 0.2  0.0 - 1.0 mg/dL   Nitrite NEGATIVE  NEGATIVE   Leukocytes, UA NEGATIVE  NEGATIVE  WET PREP, GENITAL     Status: Abnormal   Collection Time   06/15/12 10:45 PM      Component Value Range   Yeast Wet Prep HPF POC NONE SEEN  NONE SEEN   Trich, Wet Prep NONE SEEN  NONE SEEN   Clue Cells Wet Prep HPF POC FEW (*) NONE SEEN   WBC, Wet Prep HPF POC FEW (*) NONE SEEN    MDM No CVA tenderness.  Client states pain is lower into her hip and is worse with palpation of muscles in right hip.  Is concerned as she had pyelo years ago and is worried about UTI but urine is clear.  Admits to doing lots of cleaning using a broom and vacuum cleaner.  Most  likely is musculoskeletal pain.  Will give Tylenol.  Assessment and Plan  Right Back and hip pain  Pregnancy 13 weeks  Plan Can use ice to the affected area twice a day. Take Tylenol 325 mg 2 tablets by mouth every 4 hours if needed for pain. There is no urinary tract infection. See your doctor if your pain worsens.  Nahal Wanless 06/15/2012, 10:51 PM

## 2012-06-16 NOTE — MAU Provider Note (Signed)
Chart reviewed and agree with management and plan.  

## 2012-06-17 LAB — GC/CHLAMYDIA PROBE AMP
CT Probe RNA: NEGATIVE
GC Probe RNA: NEGATIVE

## 2012-06-25 ENCOUNTER — Encounter: Payer: No Typology Code available for payment source | Attending: Obstetrics & Gynecology | Admitting: Dietician

## 2012-06-25 ENCOUNTER — Ambulatory Visit (INDEPENDENT_AMBULATORY_CARE_PROVIDER_SITE_OTHER): Payer: No Typology Code available for payment source | Admitting: Obstetrics & Gynecology

## 2012-06-25 DIAGNOSIS — O99810 Abnormal glucose complicating pregnancy: Secondary | ICD-10-CM | POA: Insufficient documentation

## 2012-06-25 DIAGNOSIS — Z713 Dietary counseling and surveillance: Secondary | ICD-10-CM | POA: Insufficient documentation

## 2012-06-25 NOTE — Progress Notes (Signed)
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  PNV, Fe++, folic acid daily.  Eats 3 meals and 3 snacks/day.  Drinks mostly water.  4 oz. Of juice/day.  Eats a lot of fruits.  No food allergies. Receives [facility]. Discussed wt. Gain goals of 15-25#.  Discussed importance of consistent meal patterns and CHO:PRO combinations.   Discussed impact of fruit and juices on BS.   F/U 1-2 weeks. Wilford Sports, MPH, RD, LDN

## 2012-06-25 NOTE — Progress Notes (Signed)
Diabetes Education:  Seen for first time today.  This is her 4th pregnancy and she had no issues with her previous pregnancies.  She c/o increased tiredness and just not feeling the same with this pregnancy.  Completed a review of the recommendations for GDM.  Encouraged daily walking for 30 minutes.  Provided a True Track meter and 1 box of strips and 1 box of lancets.  On return demonstration of the BGM, her glucose level was 105 at 12:30 PM.  Provided the handout "Nutrition Diabetes and Pregnancy" in Spanish, Novo Nordisk Carb Counting Handbook in [place].  She speaks and understands English and ask to not use an interpreter.  She is concerned for the pregnancy and her health.  She will need to see mea again next visit.  Maggie Carleena Mires, RN, RD, CDE.

## 2012-06-27 ENCOUNTER — Encounter: Payer: Self-pay | Admitting: Family Medicine

## 2012-07-02 ENCOUNTER — Ambulatory Visit (INDEPENDENT_AMBULATORY_CARE_PROVIDER_SITE_OTHER): Payer: Self-pay | Admitting: Family

## 2012-07-02 VITALS — BP 115/71 | Temp 98.7°F | Wt 171.4 lb

## 2012-07-02 DIAGNOSIS — O99810 Abnormal glucose complicating pregnancy: Secondary | ICD-10-CM

## 2012-07-02 DIAGNOSIS — O0990 Supervision of high risk pregnancy, unspecified, unspecified trimester: Secondary | ICD-10-CM

## 2012-07-02 DIAGNOSIS — Z98891 History of uterine scar from previous surgery: Secondary | ICD-10-CM

## 2012-07-02 DIAGNOSIS — O34219 Maternal care for unspecified type scar from previous cesarean delivery: Secondary | ICD-10-CM

## 2012-07-02 DIAGNOSIS — O24419 Gestational diabetes mellitus in pregnancy, unspecified control: Secondary | ICD-10-CM | POA: Insufficient documentation

## 2012-07-02 LAB — POCT URINALYSIS DIP (DEVICE)
Bilirubin Urine: NEGATIVE
Glucose, UA: NEGATIVE mg/dL
Hgb urine dipstick: NEGATIVE
Ketones, ur: NEGATIVE mg/dL
Leukocytes, UA: NEGATIVE
Nitrite: NEGATIVE
Protein, ur: NEGATIVE mg/dL
Specific Gravity, Urine: 1.015 (ref 1.005–1.030)
Urobilinogen, UA: 0.2 mg/dL (ref 0.0–1.0)
pH: 6 (ref 5.0–8.0)

## 2012-07-02 NOTE — Progress Notes (Signed)
Discussed visit with Seward Grater; Wendi Maya very motivated to keep blood sugars under control; reviewed her changes in diet and increase in exercise, walking 3x/week;  FBS 80-99 (3/7); B 96-108, L 112-118, D 117-129 (3/6); desires Quad>obtain next visit; schedule growth [place] in 3 weeks. Reviewed history of csection > pt uncertain regarding repeat csection or TOLAC > given consent to review

## 2012-07-02 NOTE — Progress Notes (Signed)
P-83 

## 2012-07-02 NOTE — Progress Notes (Signed)
U/S scheduled 07/24/11 at 1030 am.

## 2012-07-02 NOTE — Progress Notes (Signed)
Initial OB note reviewed.  Pt has been transferred to high risk for GDM.

## 2012-07-09 ENCOUNTER — Ambulatory Visit (INDEPENDENT_AMBULATORY_CARE_PROVIDER_SITE_OTHER): Payer: Self-pay | Admitting: Obstetrics & Gynecology

## 2012-07-09 VITALS — BP 120/75 | Temp 97.5°F | Wt 170.6 lb

## 2012-07-09 DIAGNOSIS — O24419 Gestational diabetes mellitus in pregnancy, unspecified control: Secondary | ICD-10-CM

## 2012-07-09 DIAGNOSIS — O99810 Abnormal glucose complicating pregnancy: Secondary | ICD-10-CM

## 2012-07-09 DIAGNOSIS — O34219 Maternal care for unspecified type scar from previous cesarean delivery: Secondary | ICD-10-CM

## 2012-07-09 DIAGNOSIS — J3489 Other specified disorders of nose and nasal sinuses: Secondary | ICD-10-CM

## 2012-07-09 LAB — POCT URINALYSIS DIP (DEVICE)
Bilirubin Urine: NEGATIVE
Glucose, UA: NEGATIVE mg/dL
Hgb urine dipstick: NEGATIVE
Ketones, ur: NEGATIVE mg/dL
Leukocytes, UA: NEGATIVE
Nitrite: NEGATIVE
Protein, ur: NEGATIVE mg/dL
Specific Gravity, Urine: 1.01 (ref 1.005–1.030)
Urobilinogen, UA: 0.2 mg/dL (ref 0.0–1.0)
pH: 6 (ref 5.0–8.0)

## 2012-07-09 LAB — GLUCOSE, CAPILLARY: Glucose-Capillary: 84 mg/dL (ref 70–99)

## 2012-07-09 MED ORDER — MUPIROCIN CALCIUM 2 % EX CREA: TOPICAL_CREAM | Freq: Three times a day (TID) | CUTANEOUS | Status: DC

## 2012-07-09 NOTE — Progress Notes (Signed)
Quad screen today.  Gave TOLAC consent to review at home, will sign at next visit. Forgot BS, random CBG 84.  Empahsized she needs to bring log book/meter. No other complaints or concerns.  OB precautions reviewed.

## 2012-07-09 NOTE — Progress Notes (Signed)
Pulse-93   Vaginal discharge- "clear, white" Pt needs refill for bactroban

## 2012-07-09 NOTE — Patient Instructions (Signed)
Return to clinic for any obstetric concerns or go to [facility] for evaluation  

## 2012-07-16 ENCOUNTER — Encounter: Payer: Self-pay | Admitting: Obstetrics & Gynecology

## 2012-07-23 ENCOUNTER — Ambulatory Visit (INDEPENDENT_AMBULATORY_CARE_PROVIDER_SITE_OTHER): Payer: No Typology Code available for payment source | Admitting: Obstetrics and Gynecology

## 2012-07-23 ENCOUNTER — Encounter: Payer: Self-pay | Admitting: *Deleted

## 2012-07-23 ENCOUNTER — Telehealth: Payer: Self-pay | Admitting: *Deleted

## 2012-07-23 ENCOUNTER — Ambulatory Visit (HOSPITAL_COMMUNITY)
Admission: RE | Admit: 2012-07-23 | Discharge: 2012-07-23 | Disposition: A | Discharge status: Home / Self Care | Payer: No Typology Code available for payment source | Source: Ambulatory Visit | Attending: Family | Admitting: Family

## 2012-07-23 ENCOUNTER — Encounter: Payer: Self-pay | Admitting: Obstetrics & Gynecology

## 2012-07-23 VITALS — BP 103/72 | Temp 97.7°F | Wt 173.1 lb

## 2012-07-23 DIAGNOSIS — O09529 Supervision of elderly multigravida, unspecified trimester: Secondary | ICD-10-CM | POA: Insufficient documentation

## 2012-07-23 DIAGNOSIS — O34219 Maternal care for unspecified type scar from previous cesarean delivery: Secondary | ICD-10-CM | POA: Insufficient documentation

## 2012-07-23 DIAGNOSIS — O99810 Abnormal glucose complicating pregnancy: Secondary | ICD-10-CM

## 2012-07-23 DIAGNOSIS — O24419 Gestational diabetes mellitus in pregnancy, unspecified control: Secondary | ICD-10-CM

## 2012-07-23 DIAGNOSIS — O0990 Supervision of high risk pregnancy, unspecified, unspecified trimester: Secondary | ICD-10-CM

## 2012-07-23 LAB — POCT URINALYSIS DIP (DEVICE)
Bilirubin Urine: NEGATIVE
Glucose, UA: NEGATIVE mg/dL
Hgb urine dipstick: NEGATIVE
Ketones, ur: NEGATIVE mg/dL
Leukocytes, UA: NEGATIVE
Nitrite: NEGATIVE
Protein, ur: NEGATIVE mg/dL
Specific Gravity, Urine: 1.015 (ref 1.005–1.030)
Urobilinogen, UA: 0.2 mg/dL (ref 0.0–1.0)
pH: 6 (ref 5.0–8.0)

## 2012-07-23 NOTE — Telephone Encounter (Signed)
Pts quad screen came back showing an increased risk for down syndrome 1:191. I called patient back but was unable to reach her due to her phone being disconnected. I scheduled pt for genetic counseling on 08/02/12 and sent a certified letter informing patient.

## 2012-07-23 NOTE — Patient Instructions (Signed)
Pregnancy - Second Trimester The second trimester of pregnancy (3 to 6 months) is a period of rapid growth for you and your baby. At the end of the sixth month, your baby is about 9 inches long and weighs 1 1/2 pounds. You will begin to feel the baby move between 18 and 20 weeks of the pregnancy. This is called quickening. Weight gain is faster. A clear fluid (colostrum) may leak out of your breasts. You may feel small contractions of the womb (uterus). This is known as false labor or Braxton-Hicks contractions. This is like a practice for labor when the baby is ready to be born. Usually, the problems with morning sickness have usually passed by the end of your first trimester. Some women develop small dark blotches (called cholasma, mask of pregnancy) on their face that usually goes away after the baby is born. Exposure to the sun makes the blotches worse. Acne may also develop in some pregnant women and pregnant women who have acne, may find that it goes away. PRENATAL EXAMS  Blood work may continue to be done during prenatal exams. These tests are done to check on your health and the probable health of your baby. Blood work is used to follow your blood levels (hemoglobin). Anemia (low hemoglobin) is common during pregnancy. Iron and vitamins are given to help prevent this. You will also be checked for diabetes between 24 and 28 weeks of the pregnancy. Some of the previous blood tests may be repeated.  The size of the uterus is measured during each visit. This is to make sure that the baby is continuing to grow properly according to the dates of the pregnancy.  Your blood pressure is checked every prenatal visit. This is to make sure you are not getting toxemia.  Your urine is checked to make sure you do not have an infection, diabetes or protein in the urine.  Your weight is checked often to make sure gains are happening at the suggested rate. This is to ensure that both you and your baby are growing  normally.  Sometimes, an ultrasound is performed to confirm the proper growth and development of the baby. This is a test which bounces harmless sound waves off the baby so your caregiver can more accurately determine due dates. Sometimes, a specialized test is done on the amniotic fluid surrounding the baby. This test is called an amniocentesis. The amniotic fluid is obtained by sticking a needle into the belly (abdomen). This is done to check the chromosomes in instances where there is a concern about possible genetic problems with the baby. It is also sometimes done near the end of pregnancy if an early delivery is required. In this case, it is done to help make sure the baby's lungs are mature enough for the baby to live outside of the womb. CHANGES OCCURING IN THE SECOND TRIMESTER OF PREGNANCY Your body goes through many changes during pregnancy. They vary from person to person. Talk to your caregiver about changes you notice that you are concerned about.  During the second trimester, you will likely have an increase in your appetite. It is normal to have cravings for certain foods. This varies from person to person and pregnancy to pregnancy.  Your lower abdomen will begin to bulge.  You may have to urinate more often because the uterus and baby are pressing on your bladder. It is also common to get more bladder infections during pregnancy (pain with urination). You can help this by   drinking lots of fluids and emptying your bladder before and after intercourse.  You may begin to get stretch marks on your hips, abdomen, and breasts. These are normal changes in the body during pregnancy. There are no exercises or medications to take that prevent this change.  You may begin to develop swollen and bulging veins (varicose veins) in your legs. Wearing support hose, elevating your feet for 15 minutes, 3 to 4 times a day and limiting salt in your diet helps lessen the problem.  Heartburn may develop  as the uterus grows and pushes up against the stomach. Antacids recommended by your caregiver helps with this problem. Also, eating smaller meals 4 to 5 times a day helps.  Constipation can be treated with a stool softener or adding bulk to your diet. Drinking lots of fluids, vegetables, fruits, and whole grains are helpful.  Exercising is also helpful. If you have been very active up until your pregnancy, most of these activities can be continued during your pregnancy. If you have been less active, it is helpful to start an exercise program such as walking.  Hemorrhoids (varicose veins in the rectum) may develop at the end of the second trimester. Warm sitz baths and hemorrhoid cream recommended by your caregiver helps hemorrhoid problems.  Backaches may develop during this time of your pregnancy. Avoid heavy lifting, wear low heal shoes and practice good posture to help with backache problems.  Some pregnant women develop tingling and numbness of their hand and fingers because of swelling and tightening of ligaments in the wrist (carpel tunnel syndrome). This goes away after the baby is born.  As your breasts enlarge, you may have to get a bigger bra. Get a comfortable, cotton, support bra. Do not get a nursing bra until the last month of the pregnancy if you will be nursing the baby.  You may get a dark line from your belly button to the pubic area called the linea nigra.  You may develop rosy cheeks because of increase blood flow to the face.  You may develop spider looking lines of the face, neck, arms and chest. These go away after the baby is born. HOME CARE INSTRUCTIONS   It is extremely important to avoid all smoking, herbs, alcohol, and unprescribed drugs during your pregnancy. These chemicals affect the formation and growth of the baby. Avoid these chemicals throughout the pregnancy to ensure the delivery of a healthy infant.  Most of your home care instructions are the same as  suggested for the first trimester of your pregnancy. Keep your caregiver's appointments. Follow your caregiver's instructions regarding medication use, exercise and diet.  During pregnancy, you are providing food for you and your baby. Continue to eat regular, well-balanced meals. Choose foods such as meat, fish, milk and other low fat dairy products, vegetables, fruits, and whole-grain breads and cereals. Your caregiver will tell you of the ideal weight gain.  A physical sexual relationship may be continued up until near the end of pregnancy if there are no other problems. Problems could include early (premature) leaking of amniotic fluid from the membranes, vaginal bleeding, abdominal pain, or other medical or pregnancy problems.  Exercise regularly if there are no restrictions. Check with your caregiver if you are unsure of the safety of some of your exercises. The greatest weight gain will occur in the last 2 trimesters of pregnancy. Exercise will help you:  Control your weight.  Get you in shape for labor and delivery.  Lose weight   after you have the baby.  Wear a good support or jogging bra for breast tenderness during pregnancy. This may help if worn during sleep. Pads or tissues may be used in the bra if you are leaking colostrum.  Do not use hot tubs, steam rooms or saunas throughout the pregnancy.  Wear your seat belt at all times when driving. This protects you and your baby if you are in an accident.  Avoid raw meat, uncooked cheese, cat litter boxes and soil used by cats. These carry germs that can cause birth defects in the baby.  The second trimester is also a good time to visit your dentist for your dental health if this has not been done yet. Getting your teeth cleaned is OK. Use a soft toothbrush. Brush gently during pregnancy.  It is easier to loose urine during pregnancy. Tightening up and strengthening the pelvic muscles will help with this problem. Practice stopping your  urination while you are going to the bathroom. These are the same muscles you need to strengthen. It is also the muscles you would use as if you were trying to stop from passing gas. You can practice tightening these muscles up 10 times a set and repeating this about 3 times per day. Once you know what muscles to tighten up, do not perform these exercises during urination. It is more likely to contribute to an infection by backing up the urine.  Ask for help if you have financial, counseling or nutritional needs during pregnancy. Your caregiver will be able to offer counseling for these needs as well as refer you for other special needs.  Your skin may become oily. If so, wash your face with mild soap, use non-greasy moisturizer and oil or cream based makeup. MEDICATIONS AND DRUG USE IN PREGNANCY  Take prenatal vitamins as directed. The vitamin should contain 1 milligram of folic acid. Keep all vitamins out of reach of children. Only a couple vitamins or tablets containing iron may be fatal to a baby or young child when ingested.  Avoid use of all medications, including herbs, over-the-counter medications, not prescribed or suggested by your caregiver. Only take over-the-counter or prescription medicines for pain, discomfort, or fever as directed by your caregiver. Do not use aspirin.  Let your caregiver also know about herbs you may be using.  Alcohol is related to a number of birth defects. This includes fetal alcohol syndrome. All alcohol, in any form, should be avoided completely. Smoking will cause low birth rate and premature babies.  Street or illegal drugs are very harmful to the baby. They are absolutely forbidden. A baby born to an addicted mother will be addicted at birth. The baby will go through the same withdrawal an adult does. SEEK MEDICAL CARE IF:  You have any concerns or worries during your pregnancy. It is better to call with your questions if you feel they cannot wait, rather  than worry about them. SEEK IMMEDIATE MEDICAL CARE IF:   An unexplained oral temperature above 102 F (38.9 C) develops, or as your caregiver suggests.  You have leaking of fluid from the vagina (birth canal). If leaking membranes are suspected, take your temperature and tell your caregiver of this when you call.  There is vaginal spotting, bleeding, or passing clots. Tell your caregiver of the amount and how many pads are used. Light spotting in pregnancy is common, especially following intercourse.  You develop a bad smelling vaginal discharge with a change in the color from clear   to white.  You continue to feel sick to your stomach (nauseated) and have no relief from remedies suggested. You vomit blood or coffee ground-like materials.  You lose more than 2 pounds of weight or gain more than 2 pounds of weight over 1 week, or as suggested by your caregiver.  You notice swelling of your face, hands, feet, or legs.  You get exposed to German measles and have never had them.  You are exposed to fifth disease or chickenpox.  You develop belly (abdominal) pain. Round ligament discomfort is a common non-cancerous (benign) cause of abdominal pain in pregnancy. Your caregiver still must evaluate you.  You develop a bad headache that does not go away.  You develop fever, diarrhea, pain with urination, or shortness of breath.  You develop visual problems, blurry, or double vision.  You fall or are in a car accident or any kind of trauma.  There is mental or physical violence at home. Document Released: 06/28/2001 Document Revised: 09/26/2011 Document Reviewed: 12/31/2008 ExitCare Patient Information 2013 [employer].  

## 2012-07-23 NOTE — Progress Notes (Signed)
Pulse- 82  Edema-feet

## 2012-07-23 NOTE — Progress Notes (Signed)
Cough x 1 wk, no CP, fever or SOB; nonproductive> Try Robitussin DM, increase fluids.   A2/B  DM: CBGs all well within range.>Will get 24 hr urine. Labs when she brings in: CMP, TSH and will schedule fetal echo. [place] today.  Quad screen result pending  Weight gain slightly up> discussed. Still with lesion on nose, used Bactoban> refer to derm.

## 2012-07-23 NOTE — Progress Notes (Signed)
No Dermatology appointments available for several months. Patient advised to call her Family Practice MD and f/u with her nasal lesion.

## 2012-07-24 LAB — COMPREHENSIVE METABOLIC PANEL
ALT: 28 U/L (ref 0–35)
AST: 24 U/L (ref 0–37)
Albumin: 3.5 g/dL (ref 3.5–5.2)
Alkaline Phosphatase: 46 U/L (ref 39–117)
BUN: 5 mg/dL — ABNORMAL LOW (ref 6–23)
CO2: 24 mEq/L (ref 19–32)
Calcium: 8.5 mg/dL (ref 8.4–10.5)
Chloride: 105 mEq/L (ref 96–112)
Creat: 0.44 mg/dL — ABNORMAL LOW (ref 0.50–1.10)
Glucose, Bld: 84 mg/dL (ref 70–99)
Potassium: 4 mEq/L (ref 3.5–5.3)
Sodium: 138 mEq/L (ref 135–145)
Total Bilirubin: 0.2 mg/dL — ABNORMAL LOW (ref 0.3–1.2)
Total Protein: 6.1 g/dL (ref 6.0–8.3)

## 2012-07-24 LAB — TSH: TSH: 2.036 u[IU]/mL (ref 0.350–4.500)

## 2012-07-25 ENCOUNTER — Encounter: Payer: Self-pay | Admitting: Family

## 2012-07-26 ENCOUNTER — Encounter (HOSPITAL_COMMUNITY): Payer: No Typology Code available for payment source

## 2012-07-27 ENCOUNTER — Ambulatory Visit (HOSPITAL_COMMUNITY): Payer: No Typology Code available for payment source | Attending: Family Medicine

## 2012-07-29 LAB — CREATININE CLEARANCE, URINE, 24 HOUR
Creatinine Clearance: 127 mL/min — ABNORMAL HIGH (ref 75–115)
Creatinine, 24H Ur: 806 mg/d (ref 700–1800)
Creatinine, Urine: 30.4 mg/dL
Creatinine: 0.44 mg/dL — ABNORMAL LOW (ref 0.50–1.10)

## 2012-07-29 LAB — PROTEIN, URINE, 24 HOUR: Protein, Urine: <3 mg/dL

## 2012-08-02 ENCOUNTER — Encounter (HOSPITAL_COMMUNITY): Payer: No Typology Code available for payment source

## 2012-08-03 ENCOUNTER — Ambulatory Visit: Payer: No Typology Code available for payment source | Admitting: Family Medicine

## 2012-08-06 ENCOUNTER — Ambulatory Visit (INDEPENDENT_AMBULATORY_CARE_PROVIDER_SITE_OTHER): Payer: No Typology Code available for payment source | Admitting: Obstetrics and Gynecology

## 2012-08-06 VITALS — BP 113/71 | Temp 98.4°F | Wt 173.4 lb

## 2012-08-06 DIAGNOSIS — O24419 Gestational diabetes mellitus in pregnancy, unspecified control: Secondary | ICD-10-CM

## 2012-08-06 DIAGNOSIS — O99810 Abnormal glucose complicating pregnancy: Secondary | ICD-10-CM

## 2012-08-06 LAB — POCT URINALYSIS DIP (DEVICE)
Bilirubin Urine: NEGATIVE
Glucose, UA: NEGATIVE mg/dL
Hgb urine dipstick: NEGATIVE
Ketones, ur: NEGATIVE mg/dL
Nitrite: NEGATIVE
Protein, ur: NEGATIVE mg/dL
Specific Gravity, Urine: 1.025 (ref 1.005–1.030)
Urobilinogen, UA: 0.2 mg/dL (ref 0.0–1.0)
pH: 6 (ref 5.0–8.0)

## 2012-08-06 NOTE — Patient Instructions (Addendum)
Dolor del ligamento redondo  (Round Ligament Pain)  El ligamento redondo se compone de músculo y tejido fibroso. Está unido al útero cerca de las trompas de [place] El ligamento redondo está ubicado en ambos lados del útero y ayuda a mantener su posición. Normalmente comienza en el segundo trimestre del embarazo cuando el útero sale hacia afuera de la pelvis. El dolor puede aparecer y desaparecer hasta el nacimiento del bebé. El dolor de ligamento redondo no es un problema serio y no ocasiona daños al bebé.  CAUSAS  Durante el embarazo el útero crece mayormente desde el segundo trimestre hasta el parto. A medida que crece, se estira y tuerce ligeramente los ligamentos. Cuando el útero ejerce presión en ambos lados, el ligamento redondo del lado opuesto presiona y se estira. Esto causa dolor.  SÍNTOMAS  El dolor puede ocurrir en uno o ambos lados. El dolor es por lo general como un pellizco corto y filoso. A veces puede ser un dolor opaco y persistente. Se siente en la parte baja del abdomen o en la ingle. Es un dolor interno, y por lo general comienza en la ingle y se mueve hacia la zona de la cadera. El dolor puede ocurrir con:  · Cambio de posición repentino como el levantarse de la cama o de una silla.  · Darse vuelta en la cama.  · Toser o estornudar.  · Caminar demasiado.  · Cualquier tipo de actividad física.  DIAGNÓSTICO  El medico deberá asegurarse de que no existen problemas graves que ocasionan el dolor. Si no encuentra nada grave, los síntomas suelen indicar de que se trata de un dolor proveniente del ligamento redondo.  TRATAMIENTO  · Siéntese y relájese cuando el dolor comience.  · Llevar las rodillas hacia el estómago.  · Recuéstese sobre un lado con una almohada debajo del vientre (abdomen) y otra entre sus piernas.  · Siéntese en un baño caliente de 15 a 20 minutos o hasta que el dolor desaparezca.  INSTRUCCIONES PARA EL CUIDADO DOMICILIARIO  · Utilice los medicamentos de venta libre o de  prescripción para el dolor, el malestar o la fiebre, según se lo indique el profesional que lo asiste.  · Siéntese y póngase de pie lentamente.  · Evite caminatas largas si le ocasionan dolor.  · Detenga o disminuya las actividades físicas si le ocasionan dolor.  SOLICITE ATENCIÓN MÉDICA SI:  · El dolor no desaparece con estas medidas.  · Necesita que le prescriban medicamentos más fuertes para el dolor.  · Desarrolla un dolor de espalda que no había sentido antes junto con el de lado.  SOLICITE ATENCIÓN MÉDICA DE INMEDIATO SI:  · La temperatura se eleva por encima de 102° F (38.9° C) o más.  · Siente contracciones uterinas.  · Presenta hemorragia vaginal.  · Presenta náuseas, diarrea o vómitos.  · Comienza a sentir escalofríos  · Siente dolor al orinar.  Document Released: 06/16/2008 Document Revised: 09/26/2011  ExitCare® Patient Information ©2013 [employer].

## 2012-08-06 NOTE — Progress Notes (Signed)
Pulse 90 Patient reports some abdominal pain

## 2012-08-06 NOTE — Progress Notes (Signed)
CBGs reviewed with Maggie. Few sl high. Reviewed how to check and hs snack. 18 wk [place] all WNL RLP discussed.Concerned re Jeral Pinch risk 1:191 by [facility]. Reassured [place] nl and MOM only 1.03.

## 2012-08-20 ENCOUNTER — Encounter: Payer: Self-pay | Admitting: Obstetrics and Gynecology

## 2012-08-27 ENCOUNTER — Ambulatory Visit (INDEPENDENT_AMBULATORY_CARE_PROVIDER_SITE_OTHER): Payer: Self-pay | Admitting: Family Medicine

## 2012-08-27 VITALS — BP 107/66 | Temp 97.2°F | Wt 175.0 lb

## 2012-08-27 DIAGNOSIS — O24419 Gestational diabetes mellitus in pregnancy, unspecified control: Secondary | ICD-10-CM

## 2012-08-27 DIAGNOSIS — O99810 Abnormal glucose complicating pregnancy: Secondary | ICD-10-CM

## 2012-08-27 LAB — POCT URINALYSIS DIP (DEVICE)
Bilirubin Urine: NEGATIVE
Glucose, UA: NEGATIVE mg/dL
Hgb urine dipstick: NEGATIVE
Ketones, ur: NEGATIVE mg/dL
Leukocytes, UA: NEGATIVE
Nitrite: NEGATIVE
Protein, ur: NEGATIVE mg/dL
Specific Gravity, Urine: 1.02 (ref 1.005–1.030)
Urobilinogen, UA: 0.2 mg/dL (ref 0.0–1.0)
pH: 6.5 (ref 5.0–8.0)

## 2012-08-27 MED ORDER — FERROUS SULFATE 325 (65 FE) MG PO TABS: 325.0000 mg | ORAL_TABLET | Freq: Every day | ORAL | Status: DC

## 2012-08-27 NOTE — Progress Notes (Signed)
Pulse  Edema trace in hands and feet.  C/o of LUQ abdominal pain X1 week. No pressure.

## 2012-08-27 NOTE — Progress Notes (Signed)
Abdominal pain localized to LUQ, just when lying down -musculoskeletal. GDM: diet controlled. Fasting 60-70s, PP 70s-120. 3 values > 120 and patient states this occurs when she eats fruit. Suggest smaller portions fruit and combine with other foods. Walking 25 minutes a day. No other complaints.

## 2012-08-27 NOTE — Patient Instructions (Addendum)
Diabetes mellitus gestacional (Gestational Diabetes Mellitus) La diabetes mellitus gestacional se produce slo durante el embarazo. Aparece cuando el organismo no puede controlar adecuadamente la glucosa (azcar) que aumenta en la sangre despus de comer. Durante el [place], se produce una resistencia a la insulina (sensibilidad reducida a la insulina) debido a la liberacin de hormonas por parte de la placenta. Generalmente, el pncreas de una mujer embarazada produce la cantidad suficiente de insulina para vencer esa resistencia. Sin embargo, en la diabetes gestacional, hay insulina pero no cumple su funcin adecuadamente. Si la resistencia es lo suficientemente grave como para que el pncreas no produzca la cantidad de insulina suficiente, la glucosa extra se acumula en la sangre.  Devota Pace RIESGO DE DESARROLLAR DIABETES GESTACIONAL?  Las mujeres con historia de diabetes en la familia.  Las mujeres de ms de [address]  Las que presentan sobrepeso.  Las [employer] pertenecen a ciertos grupos tnicos (latinas, afroamericanas, norteamericanas nativas, asiticas y las originarias de las islas del Pacfico. QUE PUEDE OCURRIRLE AL BEB? Si el nivel de glucosa en sangre de la madre es demasiado elevado mientras este [place], el nivel extra de azcar pasar por el cordn umbilical hacia el beb. Algunos de los problemas del beb pueden ser:  Beb demasiado grande: si el nio recibe Chief Strategy Officer, puede aumentar mucho de [place]. Esto puede hacer que sea demasiado grande para nacer por parto normal (vaginal) por lo que ser necesario realizar una cesrea.  Bajo nivel de glucosa (hipoglucemia): el beb produce insulina extra en respuesta a la excesiva cantidad de azcar que obtiene de [employer]. Cuando el beb nace y ya no necesita insulina extra, su nivel de azcar en sangre puede disminuir.  Ictericia (coloracin amarillenta de la piel y los ojos): esto es bastante frecuente en los bebs. La  causa es la acumulacin de una sustancia qumica denominada bilirrubina. No siempre es un trastorno grave, pero se observa con frecuencia en los bebs cuyas madres sufren diabetes gestacional. RIESGOS PARA LA MADRE Las mujeres que han sufrido diabetes gestacional pueden tener ms riesgos para algunos problemas como:  Preeclampsia o toxemia, incluyendo problemas con hipertensin arterial. La presin arterial y los niveles de protenas en la orina deben controlarse con frecuencia.  Infecciones  Parto por cesrea.  Aparicin de diabetes tipo 2 en una etapa posterior de la vida. Alrededor del 30% al 50% sufrir diabetes posteriormente, especialmente las que son obesas. DIAGNSTICO Las hormonas que causan resistencia a la insulina tienen su mayor nivel alrededor de las 24 a 28 semanas del Psychiatrist. Si se experimentan sntomas, stos son similares a los sntomas que normalmente aparecen durante el embarazo.  La diabetes mellitus gestacional generalmente se diagnostica por medio de un mtodo en dos partes: 1. Despus de la 24 a 28 semanas de Psychiatrist, la mujer debe beber una solucin que contiene glucosa y Education officer, environmental un anlisis de [place]. Si el nivel de glucosa es elevado, la realizarn un segundo [place]. 2. La prueba oral de tolerancia a la glucosa, que dura aproximadamente tres horas. Despus de realizar ayuno durante la noche, se controla nivel de glucosa en sangre. La mujer bebe una solucin que contiene glucosa y Chief Executive Officer realizan anlisis de glucosa en sangre cada hora. Si la mujer tiene factores de riesgos para la diabetes mellitus gestacional, el mdico podr Programme researcher, broadcasting/film/video anlisis antes de las 24 semanas de [place]. TRATAMIENTO El tratamiento est dirigido a Insurance underwriter en sangre de la madre en un nivel normal y puede incluir:  La  planificacin de los alimentos.  Recibir insulina u otro medicamento para Sales executive nivel de glucosa en [place].  La prctica de ejercicios.  Llevar un  registro diario de los alimentos que consume.  Control y Engineer, maintenance (IT) de los niveles de glucosa en [place].  Control de los niveles de cetona en la [place], [place] esto ya no se considera necesario en la mayora de los [place]. INSTRUCCIONES PARA EL CUIDADO DOMICILIARIO Mientras est embarazada:  Siga los consejos de su mdico relacionados con los controles prenatales, la planificacin de la comida, la actividad fsica, los [place], vitaminas, los anlisis de sangre y otras pruebas y las actividades fsicas.  Lleve un registro de las comidas, las pruebas de glucosa en sangre y la cantidad de insulina que recibe (si corresponde). Muestre todo al profesional en cada consulta mdica prenatal.  Si sufre diabetes mellitus gestacional, podr tener problemas de hipoglucemia (nivel bajo de glucosa en sangre). Podr sospechar este problema si se siente repentinamente mareada, tiene temblores y/o se siente dbil. Si cree que esto le est ocurriendo, y tiene un medidor de glucosa, mida su nivel de Event organiser. Siga los consejos de su mdico sobre el modo y el momento de tratar su nivel de glucosa en sangre. Generalmente se sigue la regla 15:15 Consuma 15 g de hidratos de carbono, espere 15 minutos y Programmer, systems el nivel de glucosa en [place].Barbara Cower de 15 g de hidratos de carbono son:  1 taza de [employer].   taza de jugo.  3-4 tabletas de glucosa.  5-6 caramelos duros.  1 caja pequea de pasas de uva.   taza de gaseosa comn.  Mantenga una buena higiene para evitar infecciones.  No fume. SOLICITE ATENCIN MDICA SI:  Observa prdida vaginal con o sin picazn.  Se siente ms dbil o cansada que lo habitual.  Primus [place].  Tiene un aumento de peso repentino, 2,5 kg o ms en una semana.  Pierde peso, 1.5 kg o ms en una semana.  Su nivel de glucosa en sangre es elevado, necesita instrucciones. SOLICITE ATENCIN MDICA DE INMEDIATO SI:  Sufre una cefalea  intensa.  Se marea o pierde el conocimiento  Presenta nuseas o vmitos.  Se siente desorientada confundida.  Sufre convulsiones.  Tiene problemas de visin.  Siente Physiological scientist.  Presenta una hemorragia vaginal abundante.  Tiene contracciones uterinas.  Tiene una prdida importante de lquido por la vagina DESPUS QUE NACE EL BEB:  Concurra a todos los controles de seguimiento y Clinical biochemist los anlisis de sangre segn las indicaciones de su mdico.  Mantenga un estilo de vida saludable para evitar la diabetes en el futuro. Aqu se incluye:  Siga el plan de alimentacin saludable.  Controle su peso.  Practique actividad fsica y descanse lo necesario.  No fume.  Amamante a su beb mientras pueda. Esto disminuir la probabilidad de que usted y su beb sufran diabetes posteriormente. Para ms informacin acerca de la diabetes, visite la pgina web de Holiday representative Diabetes Association: [email or web address].cy. Para ms informacin acerca de la diabetes gestacional cite la pgina web del [employer] of Obstetricians and Gynecologists en: [email or web address]. Document Released: 04/13/2005 Document Revised: 09/26/2011 [facility] Patient Information 2013 [place], [place].  Embarazo - Segundo trimestre (Pregnancy - Second Trimester) El segundo trimestre del Psychiatrist (del 3 al ) es un perodo de evolucin rpida para usted y el beb. Hacia el final del sexto mes, el beb mide aproximadamente 23 cm y pesa 680 g. Comenzar a Pharmacologist del  beb [place] 18 y las 20 [address] de [place]. Podr sentir las pataditas ("quickening en ingls"). Hay un rpido [employer]. Puede segregar un lquido claro Charity fundraiser) de las [place]. Quizs sienta pequeas contracciones en el vientre (tero) Esto se conoce como falso trabajo de parto o contracciones de [place]. Es como una prctica del trabajo de parto que se produce cuando el beb est listo para salir.  Generalmente los problemas de vmitos matinales ya se han superado hacia el final del Medical sales representative. Algunas mujeres desarrollan pequeas manchas oscuras (que se denominan cloasma, mscara del embarazo) en la cara que normalmente se van luego del nacimiento del beb. La exposicin al sol empeora las manchas. Puede desarrollarse acn en algunas mujeres embarazadas, y puede desaparecer en aquellas que ya tienen acn. EXAMENES PRENATALES  Durante los [employer], deber seguir realizando pruebas de [place], segn avance el [place]. Estas pruebas se realizan para controlar su salud y la del beb. Tambin se realizan anlisis de sangre para [employer] niveles de [place]. La anemia (bajo nivel de hemoglobina) es frecuente durante el embarazo. Para prevenirla, se administran hierro y vitaminas. Tambin se le realizarn exmenes para saber si tiene diabetes entre las 24 y las 28 semanas del [place]. Podrn repetirle algunas de las [employer] hicieron previamente.  En cada visita le medirn el tamao del tero. Esto se realiza para asegurarse de que el beb est creciendo correctamente de acuerdo al estado del [place].  Tambin en cada visita prenatal controlarn su presin arterial. Esto se realiza para asegurarse de que no tenga toxemia.  Se controlar su orina para asegurarse de que no tenga infecciones, diabetes o protena en la orina.  Se controlar su peso regularmente para asegurarse que el aumento ocurre al ritmo indicado. Esto se hace para asegurarse que usted y el beb tienen una evolucin normal.  En algunas ocasiones se realiza una prueba de ultrasonido para confirmar el correcto desarrollo y evolucin del beb. Esta prueba se realiza con ondas sonoras inofensivas para el beb, de modo que el profesional pueda calcular ms precisamente la fecha del [place]. Algunas veces se realizan pruebas especializadas del lquido amnitico que rodea al beb. Esta prueba se denomina  amniocentesis. El lquido amnitico se obtiene introduciendo una aguja en el vientre (abdomen). Se realiza para Conservator, museum/gallery en los que existe alguna preocupacin acerca de algn problema gentico que pueda sufrir el beb. En ocasiones se lleva a cabo cerca del final del embarazo, si es necesario inducir al Apple [employer]. En este caso se realiza para asegurarse que los pulmones del beb estn lo suficientemente maduros como para que pueda vivir fuera del tero. CAMBIOS QUE OCURREN EN EL SEGUNDO TRIMESTRE DEL EMBARAZO Su organismo atravesar numerosos cambios durante el [employer]. Estos pueden variar de Neomia Dear persona a otra. [place] con el profesional que la asiste acerca los cambios que usted note y que la preocupen.  Durante el segundo trimestre probablemente sienta un aumento del apetito. Es normal tener "antojos" de Development worker, community. Esto vara de Neomia Dear persona a otra y de un embarazo a Therapist, art.  El abdomen inferior comenzar a abultarse.  Podr tener la necesidad de Geographical information systems officer con ms frecuencia debido a que el tero y el beb presionan sobre la vejiga. Tambin es frecuente contraer ms infecciones urinarias durante el embarazo (dolor al ConocoPhillips). Puede evitarlas bebiendo gran cantidad de lquidos y vaciando la vejiga antes y despus de Sales promotion account executive.  Podrn aparecer las primeras Albertson's caderas,  abdomen y mamas. Estos son cambios normales del cuerpo durante el [place]. No existen medicamentos ni ejercicios que puedan prevenir [employer]Max.  Es posible que comience a desarrollar venas inflamadas y abultadas (varices) en las piernas. El uso de medias de descanso, Optometrist sus pies durante 15 minutos, 3 a 4 veces al da y Film/video editor la sal en su dieta ayuda a Journalist, newspaper.  Podr sentir Engineering geologist gstrica a medida que el tero crece y Doctor, general practice. Puede tomar anticidos, con la autorizacin de su mdico, para Financial planner. Tambin es til  ingerir pequeas comidas 4 a 5 veces al Futures trader.  La constipacin puede tratarse con un laxante o agregando fibra a su dieta. Beber grandes cantidades de lquidos, comer vegetales, frutas y granos integrales es de [place].  Tambin es beneficioso practicar actividad fsica. Si ha sido una persona Engineer, mining, podr continuar con la Har[employer]son de las actividades durante el mismo. Si ha sido [employer], puede ser beneficioso que comience con un programa de ejercicios, Museum/gallery exhibitions officer.  Puede desarrollar hemorroides (vrices en el recto) hacia el final del segundo trimestre. Tomar baos de asiento tibios y Chemical engineer cremas recomendadas por el profesional que lo asiste sern de ayuda para los problemas de hemorroides.  Tambin podr Financial risk analyst de espalda durante este momento de su embarazo. Evite levantar objetos pesados, utilice zapatos de taco bajo y [place] buena postura para ayudar a reducir los problemas de [place].  Algunas mujeres embarazadas desarrollan hormigueo y adormecimiento de la mano y los dedos debido a la hinchazn y compresin de los ligamentos de la mueca (sndrome del tnel carpiano). Esto desaparece una vez que el beb nace.  Como sus pechos se agrandan, Pension scheme manager un sujetador ms grande. Use un sostn de soporte, cmodo y de algodn. No utilice un sostn para amamantar hasta el ltimo mes de embarazo si va a amamantar al beb.  Podr observar una lnea oscura desde el ombligo hacia la zona pbica denominada linea nigra.  Podr observar que sus mejillas se ponen coloradas debido al aumento de flujo sanguneo en la cara.  Podr desarrollar "araitas" en la cara, cuello y pecho. Esto desaparece una vez que el beb nace. INSTRUCCIONES PARA EL CUIDADO DOMICILIARIO  Es extremadamente importante que evite el cigarrillo, hierbas medicinales, alcohol y las drogas no prescriptas durante el Psychiatrist. Estas sustancias qumicas afectan la formacin y el desarrollo  del beb. Evite estas sustancias durante todo el embarazo para asegurar el nacimiento de un beb sano.  La mayor parte de los cuidados que se aconsejan son los mismos que los indicados para Financial risk analyst trimestre del Psychiatrist. Cumpla con las citas tal como se le indic. Siga las instrucciones del profesional que lo asiste con respecto al uso de los medicamentos, el ejercicio y Psychologist, forensic.  Durante el embarazo debe obtener nutrientes para usted y para su beb. Consuma alimentos balanceados a intervalos regulares. Elija alimentos como carne, pescado, [place] y otros productos lcteos descremados, vegetales, frutas, panes integrales y cereales. El Equities trader cul es el aumento de peso ideal.  Las relaciones sexuales fsicas pueden continuarse hasta cerca del fin del embarazo si no existen otros problemas. Estos problemas pueden ser la prdida temprana (prematura) de lquido amnitico de las [place], sangrado vaginal, dolor abdominal u otros problemas mdicos o del Psychiatrist.  Realice Tesoro Corporation, si no tiene restricciones. Consulte con el profesional que la asiste si no sabe con certeza si determinados  ejercicios son seguros. El mayor aumento de peso tiene Environmental consultant durante los ltimos 2 trimestres del Psychiatrist. El ejercicio la ayudar a:  Engineering geologist.  Ponerla en forma para el parto.  Ayudarla a perder peso luego de haber dado a luz.  Use un buen sostn o como los que se usan para hacer deportes para Paramedic la sensibilidad de las [place]. Tambin puede serle til si lo [place] mientras duerme. Si pierde Product manager, podr [employer]in.  No utilice la baera con agua caliente, baos turcos y saunas durante el [address]  Utilice el cinturn de seguridad sin excepcin cuando conduzca. Este la proteger a usted y al beb en caso de accidente.  Evite comer carne cruda, queso crudo, y el contacto con los utensilios y desperdicios de los gatos. Estos elementos  contienen grmenes que pueden causar defectos de nacimiento en el beb.  El segundo trimestre es un buen momento para visitar a su dentista y Software engineer si an no lo ha hecho. Es Primary school teacher los dientes limpios. Utilice un cepillo de dientes blando. Cepllese ms suavemente durante el embarazo.  Es ms fcil perder algo de orina durante el [place]. Apretar y Chief Operating Officer los msculos de la pelvis la ayudar con este problema. Practique detener la miccin cuando est en el bao. Estos son los mismos msculos que Development worker, international aid. Son [employer] mismos msculos que utiliza cuando trata de [employer] gases. Puede practicar apretando estos msculos 10 veces, y repetir esto 3 veces por da aproximadamente. Una vez que conozca qu msculos debe apretar, no realice estos ejercicios durante la miccin. Puede favorecerle una infeccin si la orina vuelve hacia atrs.  Pida ayuda si tiene necesidades econmicas, de asesoramiento o nutricionales durante el [place]. El profesional podr ayudarla con respecto a estas necesidades, o derivarla a otros especialistas.  La piel puede ponerse grasa. Si esto sucede, lvese la cara con un jabn [place], utilice un humectante no graso y [place] con base de aceite o crema. CONSUMO DE MEDICAMENTOS Y DROGAS DURANTE EL EMBARAZO  Contine tomando las vitaminas apropiadas para esta etapa tal como se le indic. Las vitaminas deben contener un miligramo de cido flico y deben suplementarse con hierro. Guarde todas las vitaminas fuera del alcance de los nios. La ingestin de slo un par de vitaminas o tabletas que contengan hierro puede ocasionar la [employer] en un beb o en un nio pequeo.  Evite el uso de [place], inclusive los de venta libre y hierbas que no hayan sido prescriptos o indicados por el profesional que la asiste. Algunos medicamentos pueden causar problemas fsicos al beb. Utilice los medicamentos de venta libre o de prescripcin para Water quality scientist, Environmental health practitioner o la [place], segn se lo indique el profesional que lo asiste. No utilice aspirina.  El consumo de alcohol est relacionado con ciertos defectos de nacimiento. Esto incluye el sndrome de alcoholismo fetal. Debe evitar el consumo de alcohol en cualquiera de sus formas. El cigarrillo causa nacimientos prematuros y bebs de [place]. El uso de drogas recreativas est absolutamente prohibido. Son muy nocivas para el beb. Un beb que nace de [employer] Express, ser adicto al nacer. Ese beb tendr los mismos sntomas de abstinencia que un adulto.  Infrmele al profesional si consume alguna droga.  No consuma drogas ilegales. Pueden causarle mucho dao al beb. SOLICITE ATENCIN MDICA SI: Tiene preguntas o preocupaciones durante su embarazo. Es mejor que llame para Science writer las dudas que esperar hasta su prxima  visita prenatal. De esta forma se sentir ms tranquila.  SOLICITE ATENCIN MDICA DE INMEDIATO SI:  La temperatura oral se eleva sin motivo por encima de 102 F (38.9 C) o segn le indique el profesional que lo asiste.  Tiene una prdida de lquido por la vagina (canal de parto). Si sospecha una ruptura de las [place], tmese la temperatura y llame al profesional para informarlo sobre esto.  Observa unas pequeas manchas, una hemorragia vaginal o elimina cogulos. Notifique al profesional acerca de la cantidad y de cuntos apsitos est utilizando. Unas pequeas manchas de sangre son algo comn durante el Psychiatrist, especialmente despus de Sales promotion account executive.  Presenta un olor desagradable en la secrecin vaginal y observa un cambio en el color, de transparente a blanco.  Contina con las nuseas y no obtiene alivio de los remedios indicados. Vomita sangre o algo similar a la borra del caf.  [place] o sube ms de 900 g. en una semana, o segn lo indicado por el profesional que la asiste.  Observa que se le Southwest Airlines, las manos, los pies o las  piernas.  Ha estado expuesta a la rubola y no ha sufrido la enfermedad.  Ha estado expuesta a la quinta enfermedad o a la varicela.  Presenta dolor abdominal. Las molestias en el ligamento redondo son Neomia Dear causa no cancerosa (benigna) frecuente de dolor abdominal durante el embarazo. El profesional que la asiste deber evaluarla.  Presenta dolor de cabeza intenso que no se [place].  Presenta fiebre, diarrea, dolor al orinar o le falta la respiracin.  Presenta dificultad para ver, visin borrosa, o visin doble.  Sufre una cada, un accidente de trnsito o cualquier tipo de trauma.  Vive en un hogar en el que existe violencia fsica o mental. Document Released: 04/13/2005 Document Revised: 09/26/2011 [facility] Patient Information 2013 [place], [place].

## 2012-09-02 ENCOUNTER — Emergency Department (HOSPITAL_COMMUNITY)
Admission: EM | Admit: 2012-09-02 | Discharge: 2012-09-02 | Disposition: A | Discharge status: Home / Self Care | Payer: Self-pay | Attending: Emergency Medicine | Admitting: Emergency Medicine

## 2012-09-02 ENCOUNTER — Encounter (HOSPITAL_COMMUNITY): Payer: Self-pay | Admitting: Emergency Medicine

## 2012-09-02 DIAGNOSIS — O99810 Abnormal glucose complicating pregnancy: Secondary | ICD-10-CM | POA: Insufficient documentation

## 2012-09-02 DIAGNOSIS — O9989 Other specified diseases and conditions complicating pregnancy, childbirth and the puerperium: Secondary | ICD-10-CM | POA: Insufficient documentation

## 2012-09-02 DIAGNOSIS — Z87448 Personal history of other diseases of urinary system: Secondary | ICD-10-CM | POA: Insufficient documentation

## 2012-09-02 DIAGNOSIS — R109 Unspecified abdominal pain: Secondary | ICD-10-CM | POA: Insufficient documentation

## 2012-09-02 DIAGNOSIS — O99340 Other mental disorders complicating pregnancy, unspecified trimester: Secondary | ICD-10-CM | POA: Insufficient documentation

## 2012-09-02 DIAGNOSIS — Z79899 Other long term (current) drug therapy: Secondary | ICD-10-CM | POA: Insufficient documentation

## 2012-09-02 DIAGNOSIS — O26899 Other specified pregnancy related conditions, unspecified trimester: Secondary | ICD-10-CM

## 2012-09-02 HISTORY — DX: Gestational diabetes mellitus in pregnancy, unspecified control: O24.419

## 2012-09-02 LAB — GLUCOSE, CAPILLARY: Glucose-Capillary: 97 mg/dL (ref 70–99)

## 2012-09-02 LAB — URINALYSIS, ROUTINE W REFLEX MICROSCOPIC
Bilirubin Urine: NEGATIVE
Glucose, UA: NEGATIVE mg/dL
Hgb urine dipstick: NEGATIVE
Ketones, ur: NEGATIVE mg/dL
Leukocytes, UA: NEGATIVE
Nitrite: NEGATIVE
Protein, ur: NEGATIVE mg/dL
Specific Gravity, Urine: 1.013 (ref 1.005–1.030)
Urobilinogen, UA: 0.2 mg/dL (ref 0.0–1.0)
pH: 6 (ref 5.0–8.0)

## 2012-09-02 NOTE — ED Notes (Signed)
Rapid OB nurse notified of pt and will see pt in ED.

## 2012-09-02 NOTE — ED Notes (Signed)
Pt into room from triage via w/c, alert, NAD, calm, interactive. OB rapid response RN at [facility] speaking with pt.
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
## 2012-09-02 NOTE — ED Notes (Signed)
EDP at [facility] speaking with pt and OB RR RN.
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
## 2012-09-17 ENCOUNTER — Encounter: Payer: Self-pay | Admitting: Advanced Practice Midwife

## 2012-09-17 LAB — POCT URINALYSIS DIP (DEVICE)
Bilirubin Urine: NEGATIVE
Glucose, UA: 100 mg/dL — AB
Hgb urine dipstick: NEGATIVE
Ketones, ur: 15 mg/dL — AB
Leukocytes, UA: NEGATIVE
Nitrite: NEGATIVE
Protein, ur: 30 mg/dL — AB
Specific Gravity, Urine: >=1.03 (ref 1.005–1.030)
Urobilinogen, UA: 1 mg/dL (ref 0.0–1.0)
pH: 6 (ref 5.0–8.0)

## 2012-09-24 ENCOUNTER — Encounter: Payer: Self-pay | Admitting: Family Medicine

## 2012-09-24 ENCOUNTER — Encounter: Payer: Self-pay | Admitting: Advanced Practice Midwife

## 2012-10-16 ENCOUNTER — Ambulatory Visit: Payer: No Typology Code available for payment source | Admitting: Family Medicine

## 2012-10-19 ENCOUNTER — Ambulatory Visit (INDEPENDENT_AMBULATORY_CARE_PROVIDER_SITE_OTHER): Payer: No Typology Code available for payment source | Admitting: Family Medicine

## 2012-10-19 VITALS — BP 115/75 | HR 84 | Ht 64.0 in | Wt 182.0 lb

## 2012-10-19 DIAGNOSIS — M7711 Lateral epicondylitis, right elbow: Secondary | ICD-10-CM

## 2012-10-19 DIAGNOSIS — M7710 Lateral epicondylitis, unspecified elbow: Secondary | ICD-10-CM

## 2012-10-19 MED ORDER — LIDOCAINE 5 % EX PTCH: 1.0000 | MEDICATED_PATCH | CUTANEOUS | Status: DC

## 2012-10-19 NOTE — Patient Instructions (Addendum)
Fue un Research officer, trade union.  Creo que Chief Technology Officer en el codo derecho se debe a una condicion que se llama de epicondilitis.   Quiero que doble una toalla de bano por el lado largo y la envuelva por el codo derecho antes de Liberal.  Quitesela en la [place].  Esto le quita la presion del nervio que le esta' provocando la [place].   Puede tomar Tylenol 500mg , una tableta cada 4 a 6 horas segun necesite.  Le estoy dando una receta para un parche que se llama de Lidoderm.  Puede cortarlo al tamano de una moneda de 25 centavos y aplicarlo al codo donde mas le molesta por 12 horas.  Si estas medidas no le ayudan, por favor llame de nuevo y ahi podemos considerar la idea de una inyeccion al codo.  Para la resequedad de la boca, recomiendo una bala sin azucar para ayudarle a producir mas saliva.

## 2012-10-19 NOTE — Progress Notes (Signed)
  Subjective:    Patient ID: Wanda Powers, female    DOB: May 21, 1976, 37 y.o.   MRN: [id number]  HPI Patient here for complaint of R-sided elbow pain, which she has had for the better part of a year now.  She is [redacted]w[redacted]d EGA today, and she gets her pregnancy care from [facility] clinic at [facility] for her diet-controlled GDM.    She describes the pain as being localized to the right elbow, lateral epicondyle.  She says that since becoming pregnant, the pain has tended to radiate down her forearm toward her wrist.  She does not have loss of strength in the wrist or hand. No recent trauma or injury.  In the past she has seen Dr. Pearletha Forge from Sports Medicine, and she was referred to PT and had a topical NTG patch applied. This was helpful to her.  She has taken some acetaminophen without much relief.   Also remarks about some dryness in her mouth, which is present all the time.  She does not have pain in the mouth/tongue or along the parotids.     Review of Systems No fevers or chills, good active fetal movement.  No LOF or vaginal bleeding/discharge.  No ankle edema.      Objective:   Physical Exam Well appearing, no apparent distress HEENT Neck supple.  Moist mucus membranes. Gloved palpation of mouth without parotid tenderness or mass.  ABD Gravid uterus. FHR 135bpm with bedside doppler today.  EXTS; Right elbow with point tenderness over the lateral epicondyle. Full active ROM wrists bilaterally; full handgrip bilaterally. Negative tinnels R wrist.       Assessment & Plan:

## 2012-10-19 NOTE — Assessment & Plan Note (Signed)
Exam consistent with lateral epicondylitis.  Acetaminophen not helpful.  Avoiding NSAIDs, now in third trimester of pregnancy.  Would be a candidate for steroid injection, but may make her dietary control of GDM more challenging.  To limit exacerbation by wrapping at bedtime; ice applications; use of Lidoderm patch for 12hr/day.  She is instructed to purchase one patch at first and cut into size that she needs before deciding to buy the whole box.  For follow up if not improving.

## 2012-10-22 ENCOUNTER — Ambulatory Visit (INDEPENDENT_AMBULATORY_CARE_PROVIDER_SITE_OTHER): Payer: No Typology Code available for payment source | Admitting: Advanced Practice Midwife

## 2012-10-22 VITALS — BP 114/74 | Temp 98.4°F | Wt 183.2 lb

## 2012-10-22 DIAGNOSIS — O24419 Gestational diabetes mellitus in pregnancy, unspecified control: Secondary | ICD-10-CM

## 2012-10-22 DIAGNOSIS — O99810 Abnormal glucose complicating pregnancy: Secondary | ICD-10-CM

## 2012-10-22 LAB — RPR

## 2012-10-22 LAB — POCT URINALYSIS DIP (DEVICE)
Bilirubin Urine: NEGATIVE
Glucose, UA: NEGATIVE mg/dL
Hgb urine dipstick: NEGATIVE
Ketones, ur: NEGATIVE mg/dL
Leukocytes, UA: NEGATIVE
Nitrite: NEGATIVE
Protein, ur: NEGATIVE mg/dL
Specific Gravity, Urine: 1.015 (ref 1.005–1.030)
Urobilinogen, UA: 0.2 mg/dL (ref 0.0–1.0)
pH: 7 (ref 5.0–8.0)

## 2012-10-22 LAB — CBC
HCT: 35.4 % — ABNORMAL LOW (ref 36.0–46.0)
Hemoglobin: 12.2 g/dL (ref 12.0–15.0)
MCH: 29.7 pg (ref 26.0–34.0)
MCHC: 34.5 g/dL (ref 30.0–36.0)
MCV: 86.1 fL (ref 78.0–100.0)
Platelets: 182 10*3/uL (ref 150–400)
RBC: 4.11 MIL/uL (ref 3.87–5.11)
RDW: 15.7 % — ABNORMAL HIGH (ref 11.5–15.5)
WBC: 6.9 10*3/uL (ref 4.0–10.5)

## 2012-10-22 LAB — HIV ANTIBODY (ROUTINE TESTING W REFLEX): HIV: NONREACTIVE

## 2012-10-22 NOTE — Progress Notes (Signed)
Does not have blood sugar log - fastings 75-85, postprandials normal, once or twice a week in 120s - rev'd importance of bringing log. Will see Maggie today. Has missed last several appointments - last visit at 23 weeks. Rev'd precautions. Not sure about contraception. Plans breast/bottle feeding. Rev'd precautions. Return in 1 week to review blood sugars.

## 2012-10-22 NOTE — Patient Instructions (Signed)
Embarazo  Tercer trimestre  (Pregnancy - Third Trimester) El tercer trimestre del embarazo (los ltimos 3 meses) es el perodo en el cual tanto usted como su beb crecen con ms rapidez. El beb alcanza un largo de aproximadamente 50 cm. y pesa entre 2,700 y 4,500 kg. El beb gana ms tejido graso y est listo para la vida fuera del cuerpo de la madre. Mientras estn en el interior, los bebs tienen perodos de sueo y vigilia, succionan el pulgar y tienen hipo. Quizs sienta pequeas contracciones del tero. Este es el falso trabajo de parto. Tambin se las conoce como contracciones de Braxton-Hicks . Es como una prctica del parto. Los problemas ms habituales de esta etapa del embarazo incluyen mayor dificultad para respirar, hinchazn de las manos y los pies por retencin de lquidos y la necesidad de orinar con ms frecuencia debido a que el tero y el beb presionan sobre la vejiga.  EXAMENES PRENATALES   Durante los exmenes prenatales, deber seguir realizndose anlisis de sangre. Estas pruebas se realizan para controlar su salud y la del beb. Los anlisis de sangre se realizan para conocer los niveles de algunos compuestos de la sangre (hemoglobina). La anemia (bajo nivel de hemoglobina) es frecuente durante el embarazo. Para prevenirla, se administran hierro y vitaminas. Tambin le tomarn nuevas anlisis para descartar diabetes. Podrn repetirle algunas de las pruebas que le hicieron previamente.  En cada visita le medirn el tamao del tero. Esto permite asegurar que el beb se desarrolla adecuadamente, segn la fecha del embarazo.  Le controlarn la presin arterial en cada visita prenatal. Esto es para asegurarse de que no sufre toxemia.  Le harn un anlisis de orina en cada visita prenatal, para descartar infecciones, diabetes y la presencia de protenas.  Tambin en cada visita controlarn su peso. Esto se realiza para asegurarse que aumenta de peso al ritmo indicado y que usted y su  beb evolucionan normalmente.  En algunas ocasiones se realiza una prueba de ultrasonido para confirmar el correcto desarrollo y evolucin del beb. Esta prueba se realiza con ondas sonoras inofensivas para el beb, de modo que el profesional pueda calcular ms precisamente la fecha del parto.  Analice con su mdico los analgsicos y la anestesia que recibir durante el trabajo de parto y el parto.  Comente la posibilidad de que necesite una cesrea y qu anestesia se recibir.  Informe a su mdico si sufre violencia familiar mental o fsica. A veces, se indica la prueba especializada sin estrs, la prueba de tolerancia a las contracciones y el perfil biofsico para asegurarse de que el beb no tiene problemas. El estudio del lquido amnitico que rodea al beb se llama amniocentesis. El lquido amnitico se obtiene introduciendo una aguja en el vientre (abdomen ). En ocasiones se lleva a cabo cerca del final del embarazo, si es necesario inducir a un parto. En este caso se realiza para asegurarse que los pulmones del beb estn lo suficientemente maduros como para que pueda vivir fuera del tero. Si los pulmones no han madurado y es peligroso que el beb nazca, se administrar a la madre una inyeccin de cortisona , 1 a 2 das antes del parto. . Esto ayuda a que los pulmones del beb maduren y sea ms seguro su nacimiento.  CAMBIOS QUE OCURREN EN EL TERCER TRIMESTRE DEL EMBARAZO  Su organismo atravesar numerosos cambios durante el embarazo. Estos pueden variar de una persona a otra. Converse con el profesional que la asiste acerca los cambios que   usted note y que la preocupen.   Durante el ltimo trimestre probablemente sienta un aumento del apetito. Es normal tener "antojos" de ciertas comidas. Esto vara de una persona a otra y de un embarazo a otro.  Podrn aparecer las primeras estras en las caderas, abdomen y mamas. Estos son cambios normales del cuerpo durante el embarazo. No existen  medicamentos ni ejercicios que puedan prevenir estos cambios.  La constipacin puede tratarse con un laxante o agregando fibra a su dieta. Beber grandes cantidades de lquidos, tomar fibras en forma de vegetales, frutas y granos integrales es de gran ayuda.  Tambin es beneficioso practicar actividad fsica. Si ha sido una persona activa hasta el embarazo, podr continuar con la mayora de las actividades durante el mismo. Si ha sido menos activa, puede ser beneficioso que comience con un programa de ejercicios, como realizar caminatas. Consulte con el profesional que la asiste antes de comenzar un programa de ejercicios.  Evite el consumo de cigarrillos, el alcohol, los medicamentos no recetados y las "drogas de la calle" durante el embarazo. Estas sustancias qumicas afectan la formacin y el desarrollo del beb. Evite estas sustancias durante todo el embarazo para asegurar el nacimiento de un beb sano.  Podr sentir dolor de espalda, tener vrices en las venas y hemorroides, o si ya los sufra, pueden empeorar.  Durante el tercer trimestre se cansar con ms facilidad, lo cual es normal.  Los movimientos del beb pueden ser ms fuertes y con ms frecuencia.  Puede que note dificultades para respirar normalmente.  El ombligo puede salir hacia afuera.  A veces sale una secrecin amarilla de las mamas, que se llama calostro.  Podr aparecer una secrecin mucosa con sangre. Esto suele ocurrir entre unos pocos das y una semana antes del parto. INSTRUCCIONES PARA EL CUIDADO EN EL HOGAR   Cumpla con las citas de control. Siga las indicaciones del mdico con respecto al uso de medicamentos, los ejercicios y la dieta.  Durante el embarazo debe obtener nutrientes para usted y para su beb. Consuma alimentos balanceados a intervalos regulares. Elija alimentos como carne, pescado, leche y otros productos lcteos descremados, vegetales, frutas, panes integrales y cereales. El mdico le informar  cul es el aumento de peso ideal.  Las relaciones sexuales pueden continuarse hasta casi el final del embarazo, si no se presentan otros problemas como prdida prematura (antes de tiempo) de lquido amnitico, hemorragia vaginal o dolor en el vientre (abdominal).  Realice actividad fsica todos los das, si no tiene restricciones. Consulte con el profesional que la asiste si no sabe con certeza si determinados ejercicios son seguros. El mayor aumento de peso se producir en los ltimos 2 trimestres del embarazo. El ejercicio ayuda a:  Controlar su peso.  Mantenerse en forma para el trabajo de parto y el parto .  Perder peso despus del parto.  Haga reposo con frecuencia, con las piernas elevadas, o segn lo necesite para evitar los calambres y el dolor de cintura.  Use un buen sostn o como los que se usan para hacer deportes para aliviar la sensibilidad de las mamas. Tambin puede serle til si lo usa mientras duerme. Si pierde calostro, podr utilizar apsitos en el sostn.  No utilice la baera con agua caliente, baos turcos y saunas.  Colquese el cinturn de seguridad cuando conduzca. Este la proteger a usted y al beb en caso de accidente.  Evite comer carne cruda y el contacto con los utensilios y desperdicios de los gatos. Estos elementos   contienen grmenes que pueden causar defectos de nacimiento en el beb.  Es fcil perder algo de orina durante el embarazo. Apretar y fortalecer los msculos de la pelvis la ayudar con este problema. Practique detener la miccin cuando est en el bao. Estos son los mismos msculos que necesita fortalecer. Son tambin los mismos msculos que utiliza cuando trata de evitar despedir gases. Puede practicar apretando estos msculos diez veces, y repetir esto tres veces por da aproximadamente. Una vez que conozca qu msculos debe apretar, no realice estos ejercicios durante la miccin. Puede favorecerle una infeccin si la orina vuelve hacia  atrs.  Pida ayuda si tienen necesidades financieras, teraputicas o nutricionales. El profesional podr ayudarla con respecto a estas necesidades, o derivarla a otros especialistas.  Haga una lista de nmeros telefnicos de emergencia y tngalos disponibles.  Planifique como obtener ayuda de familiares o amigos cuando regrese a casa desde el hospital.  Hacer un ensayo sobre la partida al hospital.  Tome clases prenatales con el padre para entender, practicar y hacer preguntas sobre el trabajo de parto y el alumbramiento.  Preparar la habitacin del beb / busque una guardera.  No viaje fuera de la ciudad a menos que sea absolutamente necesario y con el asesoramiento de su mdico.  Use slo zapatos de tacn bajo o sin tacn para tener mejor equilibrio y evitar cadas. USO DE MEDICAMENTOS Y CONSUMO DE DROGAS DURANTE EL EMBARAZO   Tome las vitaminas apropiadas para esta etapa tal como se le indic. Las vitaminas deben contener un miligramo de cido flico. Guarde todas las vitaminas fuera del alcance de los nios. La ingestin de slo un par de vitaminas o tabletas que contengan hierro pueden ocasionar la muerte en un beb o en un nio pequeo.  Evite el uso de todos los medicamentos, incluyendo hierbas, medicamentos de venta libre, sin receta o que no hayan sido sugeridos por su mdico. Slo tome medicamentos de venta libre o medicamentos recetados para el dolor, el malestar o fiebre como lo indique su mdico. No tome aspirina, ibuprofeno (Motrin, Advil, Nuprin) o naproxeno (Aleve) excepto que su mdico se lo indique.  Infrmele al profesional si consume alguna droga.  El alcohol se relaciona con ciertos defectos congnitos. Incluye el sndrome de alcoholismo fetal. Debe evitar absolutamente el consumo de alcohol, en cualquier forma. El fumar produce baja tasa de natalidad y bebs prematuros.  Las drogas ilegales o de la calle son muy perjudiciales para el beb. Estn absolutamente  prohibidas. Un beb que nace de una madre adicta, ser adicto al nacer. Ese beb tendr los mismos sntomas de abstinencia que un adulto. SOLICITE ATENCIN MDICA SI:  Tiene preguntas o preocupaciones relacionadas con el embarazo. Es mejor que llame para formular las preguntas si no puede esperar hasta la prxima visita, que sentirse preocupada por ellas.  DECISIONES ACERCA DE LA CIRCUNCISIN  Usted puede saber o no cul es el sexo de su beb. Si ya sabe que ser un varn, este es el momento de pensar acerca de la circuncisin. La circuncisin es la extirpacin del prepucio. Esta es la piel que cubre el extremo sensible del pene. No hay un motivo mdico que lo justifique. Generalmente la decisin se toma segn lo que sea popular en ese momento, o segn creencias religiosas. Podr conversar estos temas con su mdico o con el pediatra.  SOLICITE ATENCIN MDICA DE INMEDIATO SI:   La temperatura oral le sube a ms de 102 F (38.9 C) o lo que su mdico le   indique.  Tiene una prdida de lquido por la vagina (canal de parto). Si sospecha una ruptura de las membranas, tmese la temperatura y llame al profesional para informarlo sobre esto.  Observa unas pequeas manchas, una hemorragia vaginal o elimina cogulos. Notifique al profesional acerca de la cantidad y de cuntos apsitos est utilizando.  Presenta un olor desagradable en la secrecin vaginal y observa un cambio en el color, de transparente a blanco.  Ha vomitado durante ms de 24 horas.  Siente escalofros o le sube la fiebre.  Le falta el aire.  Siente ardor al orinar.  Baja o sube ms de 2 libras (900 g), o segn lo indicado por el profesional que la asiste.  Observa que sbitamente se le hinchan el rostro, las manos, los pies o las piernas.  Siente dolor en el vientre (abdominal). Las molestias en el ligamento redondo son una causa benigna frecuente de dolor abdominal durante el embarazo. El profesional que la asiste deber  evaluarla.  Presenta dolor de cabeza intenso que no se alivia.  Tiene problemas visuales, visin doble o borrosa.  Si no siente los movimientos del beb durante ms de 1 hora. Si piensa que el beb no se mueve tanto como lo haca habitualmente, coma algo que contenga azcar y recustese sobre el lado izquierdo durante una hora. El beb debe moverse al menos 4  5 veces por hora. Comunquese inmediatamente si el beb se mueve menos que lo indicado.  Se cae, se ve involucrada en un accidente automovilstico o sufre algn tipo de traumatismo.  En su hogar hay violencia mental o fsica. Document Released: 04/13/2005 Document Revised: 01/03/2012 [employer] Patient Information 2013 [employer].  

## 2012-10-22 NOTE — Progress Notes (Signed)
Pulse: 100

## 2012-10-22 NOTE — Progress Notes (Signed)
Diabetes Education:  Comes today after missing many appointments.  Related she came last week, but was not seen.  Today, she request strips.  Tells me that she informed the nurse that her glucose was greater than the 120 mg for post-meal checks.  Relates that her glucose log was damaged.  Provided her with a new glucose log and reviewed the need to control carbohydrate intake.  Recommended that she use the carb counting guide, her food labels, and measure carb servings.  If hungry, have more protein and plan for larger servings of the non-starchy veggies.  She reports she is walking.  Provided 1 box (50 strips) and instructed to bring meter and the new glucose log to her next clinic appointment.  Maggie Shavaughn Seidl, RN, RD, CDE

## 2012-10-23 ENCOUNTER — Encounter: Payer: No Typology Code available for payment source | Attending: Family Medicine | Admitting: Dietician

## 2012-10-23 DIAGNOSIS — O99810 Abnormal glucose complicating pregnancy: Secondary | ICD-10-CM | POA: Insufficient documentation

## 2012-10-23 DIAGNOSIS — Z713 Dietary counseling and surveillance: Secondary | ICD-10-CM | POA: Insufficient documentation

## 2012-11-05 ENCOUNTER — Encounter: Payer: No Typology Code available for payment source | Admitting: Obstetrics & Gynecology

## 2012-11-12 ENCOUNTER — Ambulatory Visit (INDEPENDENT_AMBULATORY_CARE_PROVIDER_SITE_OTHER): Payer: No Typology Code available for payment source | Admitting: Family

## 2012-11-12 ENCOUNTER — Other Ambulatory Visit: Payer: Self-pay | Admitting: Family

## 2012-11-12 VITALS — BP 120/57 | Temp 97.3°F | Wt 187.0 lb

## 2012-11-12 DIAGNOSIS — O34219 Maternal care for unspecified type scar from previous cesarean delivery: Secondary | ICD-10-CM

## 2012-11-12 DIAGNOSIS — O99810 Abnormal glucose complicating pregnancy: Secondary | ICD-10-CM

## 2012-11-12 DIAGNOSIS — O24419 Gestational diabetes mellitus in pregnancy, unspecified control: Secondary | ICD-10-CM

## 2012-11-12 LAB — POCT URINALYSIS DIP (DEVICE)
Bilirubin Urine: NEGATIVE
Glucose, UA: NEGATIVE mg/dL
Hgb urine dipstick: NEGATIVE
Ketones, ur: NEGATIVE mg/dL
Leukocytes, UA: NEGATIVE
Nitrite: NEGATIVE
Protein, ur: NEGATIVE mg/dL
Specific Gravity, Urine: 1.015 (ref 1.005–1.030)
Urobilinogen, UA: 0.2 mg/dL (ref 0.0–1.0)
pH: 6.5 (ref 5.0–8.0)

## 2012-11-12 MED ORDER — HYDROCORTISONE ACE-PRAMOXINE 1-1 % RE FOAM: 1.0000 | Freq: Two times a day (BID) | RECTAL | Status: DC

## 2012-11-12 MED ORDER — CETIRIZINE HCL 10 MG PO CAPS: 10.0000 mg | ORAL_CAPSULE | Freq: Every day | ORAL | Status: DC

## 2012-11-12 NOTE — Progress Notes (Signed)
Pulse: 85

## 2012-11-12 NOTE — Progress Notes (Signed)
U/S scheduled 11/15/12 at 1245pm.

## 2012-11-12 NOTE — Progress Notes (Signed)
Reviewed blood sugar log; FBS 66-81, PB 78-82, PL 79-89; PD 115-127 (5/12 abnormal); schedule growth ultrasound; zyrtec for allergies; TOLAC consent obtained.

## 2012-11-15 ENCOUNTER — Ambulatory Visit (HOSPITAL_COMMUNITY)
Admission: RE | Admit: 2012-11-15 | Discharge: 2012-11-15 | Disposition: A | Discharge status: Home / Self Care | Payer: No Typology Code available for payment source | Source: Ambulatory Visit | Attending: Family | Admitting: Family

## 2012-11-15 DIAGNOSIS — O34219 Maternal care for unspecified type scar from previous cesarean delivery: Secondary | ICD-10-CM | POA: Insufficient documentation

## 2012-11-15 DIAGNOSIS — O99810 Abnormal glucose complicating pregnancy: Secondary | ICD-10-CM | POA: Insufficient documentation

## 2012-11-15 DIAGNOSIS — O24419 Gestational diabetes mellitus in pregnancy, unspecified control: Secondary | ICD-10-CM

## 2012-11-15 DIAGNOSIS — O09529 Supervision of elderly multigravida, unspecified trimester: Secondary | ICD-10-CM | POA: Insufficient documentation

## 2012-11-16 ENCOUNTER — Encounter: Payer: Self-pay | Admitting: Family

## 2012-11-26 ENCOUNTER — Ambulatory Visit (INDEPENDENT_AMBULATORY_CARE_PROVIDER_SITE_OTHER): Payer: No Typology Code available for payment source | Admitting: Obstetrics & Gynecology

## 2012-11-26 ENCOUNTER — Other Ambulatory Visit: Payer: Self-pay | Admitting: Obstetrics & Gynecology

## 2012-11-26 VITALS — BP 123/74 | Temp 97.0°F | Wt 190.6 lb

## 2012-11-26 DIAGNOSIS — O34219 Maternal care for unspecified type scar from previous cesarean delivery: Secondary | ICD-10-CM

## 2012-11-26 LAB — POCT URINALYSIS DIP (DEVICE)
Bilirubin Urine: NEGATIVE
Glucose, UA: NEGATIVE mg/dL
Hgb urine dipstick: NEGATIVE
Ketones, ur: NEGATIVE mg/dL
Nitrite: NEGATIVE
Protein, ur: 30 mg/dL — AB
Specific Gravity, Urine: 1.02 (ref 1.005–1.030)
Urobilinogen, UA: 0.2 mg/dL (ref 0.0–1.0)
pH: 7 (ref 5.0–8.0)

## 2012-11-26 NOTE — Patient Instructions (Addendum)
Vaginal Birth After Cesarean Delivery  Vaginal birth after Cesarean delivery (VBAC) is giving birth vaginally after previously delivering a baby by a cesarean. In the past, if a woman had a Cesarean delivery, all births afterwards would be done by Cesarean delivery. This is no longer true. It can be safe for the mother to try a vaginal delivery after having a Cesarean. The final decision to have a VBAC or repeat Cesarean delivery should be between the patient and her caregiver. The risks and benefits can be discussed relative to the reason for, and the type of the previous Cesarean delivery.  WOMEN WHO PLAN TO HAVE A VBAC SHOULD CHECK WITH THEIR DOCTOR TO BE SURE THAT:  · The previous Cesarean was done with a low transverse uterine incision (not a vertical classical incision).  · The birth canal is big enough for the baby.  · There were no other operations on the uterus.  · They will have an electronic fetal monitor (EFM) on at all times during labor.  · An operating room would be available and ready in case an emergency Cesarean is needed.  · A doctor and surgical nursing staff would be available at all times during labor to be ready to do an emergency Cesarean if necessary.  · An anesthesiologist would be present in case an emergency Cesarean is needed.  · The nursery is prepared and has adequate personnel and necessary equipment available to care for the baby in case of an emergency Cesarean.  BENEFITS OF VBAC:  · Shorter stay in the hospital.  · Lower delivery, nursery and hospital costs.  · Less blood loss and need for blood transfusions.  · Less fever and discomfort from major surgery.  · Lower risk of blood clots.  · Lower risk of infection.  · Shorter recovery after going home.  · Lower risk of other surgical complications, such as opening of the incision or hernia in the incision.  · Decreased risk of injury to other organs.  · Decreased risk for having to remove the uterus (hysterectomy).  · Decreased risk  for the placenta to completely or partially cover the opening of the uterus (placenta previa) with a future pregnancy.  · Ability to have a larger family if desired.  RISKS OF A VBAC:  · Rupture of the uterus.  · Having to remove the uterus (hysterectomy) if it ruptures.  · All the complications of major surgery and/or injury to other organs.  · Excessive bleeding, blood clots and infection.  · Lower Apgar scores (method to evaluate the newborn based on appearance, pulse, grimace, activity, and respiration) and more risks to the baby.  · There is a higher risk of uterine rupture if you induce or augment labor.  · There is a higher risk of uterine rupture if you use medications to ripen the cervix.  VBAC SHOULD NOT BE DONE IF:  · The previous Cesarean was done with a vertical (classical) or T-shaped incision, or you do not know what kind of an incision was made.  · You had a ruptured uterus.  · You had surgery on your uterus.  · You have medical or obstetrical problems.  · There are problems with the baby.  · There were two previous Cesarean deliveries and no vaginal deliveries.  OTHER FACTS TO KNOW ABOUT VBAC:  · It is safe to have an epidural anesthetic with VBAC.  · It is safe to turn the baby from a breech position (attempt an external   cephalic version).  · It is safe to try a VBAC with twins.  · Pregnancies later than 40 weeks have not been successful with VBAC.  · There is an increased failure rate of a VBAC in obese pregnant women.  · There is an increased failure rate with VABC if the baby weighs 8.8 pounds (4000 grams) or more.  · There is an increased failure rate if the time between the Cesarean and VBAC is less than 19 months.  · There is an increased failure rate if pre-eclampsia is present (high blood pressure, protein in the urine and swelling of face and extremities).  · VBAC is very successful if there was a previous vaginal birth.  · VBAC is very successful when the labor starts spontaneously before  the due date.  · Delivery of VBAC is similar to having a normal spontaneous vaginal delivery.  It is important to discuss VBAC with your caregiver early in the pregnancy so you can understand the risks, benefits and options. It will give you time to decide what is best in your particular case relevant to the reason for your previous Cesarean delivery. It should be understood that medical changes in the mother or pregnancy may occur during the pregnancy, which make it necessary to change you or your caregiver's initial decision. The counseling, concerns and decisions should be documented in the medical record and signed by all parties.  Document Released: 12/25/2006 Document Revised: 09/26/2011 Document Reviewed: 08/15/2008  ExitCare® Patient Information ©2013 [employer].

## 2012-11-26 NOTE — Progress Notes (Signed)
FBS <90, and PP usually<120, continue diet control

## 2012-11-27 LAB — GC/CHLAMYDIA PROBE AMP
CT Probe RNA: NEGATIVE
GC Probe RNA: NEGATIVE

## 2012-11-29 LAB — CULTURE, BETA STREP (GROUP B ONLY)

## 2012-12-03 ENCOUNTER — Ambulatory Visit (INDEPENDENT_AMBULATORY_CARE_PROVIDER_SITE_OTHER): Payer: No Typology Code available for payment source | Admitting: Obstetrics and Gynecology

## 2012-12-03 ENCOUNTER — Encounter: Payer: Self-pay | Admitting: Obstetrics and Gynecology

## 2012-12-03 VITALS — BP 122/78 | Temp 97.3°F | Wt 189.1 lb

## 2012-12-03 DIAGNOSIS — O99810 Abnormal glucose complicating pregnancy: Secondary | ICD-10-CM

## 2012-12-03 DIAGNOSIS — O34219 Maternal care for unspecified type scar from previous cesarean delivery: Secondary | ICD-10-CM

## 2012-12-03 DIAGNOSIS — O24419 Gestational diabetes mellitus in pregnancy, unspecified control: Secondary | ICD-10-CM

## 2012-12-03 DIAGNOSIS — N898 Other specified noninflammatory disorders of vagina: Secondary | ICD-10-CM

## 2012-12-03 LAB — POCT URINALYSIS DIP (DEVICE)
Bilirubin Urine: NEGATIVE
Glucose, UA: NEGATIVE mg/dL
Hgb urine dipstick: NEGATIVE
Ketones, ur: NEGATIVE mg/dL
Leukocytes, UA: NEGATIVE
Nitrite: NEGATIVE
Protein, ur: NEGATIVE mg/dL
Specific Gravity, Urine: 1.02 (ref 1.005–1.030)
Urobilinogen, UA: 0.2 mg/dL (ref 0.0–1.0)
pH: 7 (ref 5.0–8.0)

## 2012-12-03 NOTE — Progress Notes (Signed)
Pulse- 75 Patient reports pelvic pressure and occasional contractions; also reports white d/c with itching/irritation

## 2012-12-03 NOTE — Addendum Note (Signed)
Addended by: Franchot Mimes on: 12/03/2012 01:27 PM   Modules accepted: Orders

## 2012-12-03 NOTE — Progress Notes (Signed)
Patient did not bring CBG log, patient reports CBG well controlled. Will schedule 38 week growth ultrasound. FM/labor precautions reviewed

## 2012-12-03 NOTE — Progress Notes (Signed)
U/S scheduled May 27 th at 845 am.

## 2012-12-04 ENCOUNTER — Other Ambulatory Visit: Payer: Self-pay | Admitting: Obstetrics and Gynecology

## 2012-12-04 ENCOUNTER — Telehealth: Payer: Self-pay | Admitting: *Deleted

## 2012-12-04 LAB — WET PREP, GENITAL
Trich, Wet Prep: NONE SEEN
Yeast Wet Prep HPF POC: NONE SEEN

## 2012-12-04 MED ORDER — METRONIDAZOLE 500 MG PO TABS: 500.0000 mg | ORAL_TABLET | Freq: Two times a day (BID) | ORAL | Status: DC

## 2012-12-04 NOTE — Telephone Encounter (Addendum)
Message copied by Jill Side on Tue Dec 04, 2012  1:42 PM ------      Message from: Catalina Antigua      Created: Tue Dec 04, 2012  9:31 AM       Please inform patient of positive BV. Flagyl Rx in EPIC but will not e-prescribe. Please call it in            Mason ------ Called pt and informed her of test result as well as treatment prescribed. Pt voiced understanding. Rx was called in to [facility] pharmacy

## 2012-12-11 ENCOUNTER — Ambulatory Visit (HOSPITAL_COMMUNITY)
Admission: RE | Admit: 2012-12-11 | Discharge: 2012-12-11 | Disposition: A | Discharge status: Home / Self Care | Payer: No Typology Code available for payment source | Source: Ambulatory Visit | Attending: Obstetrics and Gynecology | Admitting: Obstetrics and Gynecology

## 2012-12-11 DIAGNOSIS — O34219 Maternal care for unspecified type scar from previous cesarean delivery: Secondary | ICD-10-CM

## 2012-12-11 DIAGNOSIS — O24419 Gestational diabetes mellitus in pregnancy, unspecified control: Secondary | ICD-10-CM

## 2012-12-11 DIAGNOSIS — O09529 Supervision of elderly multigravida, unspecified trimester: Secondary | ICD-10-CM | POA: Insufficient documentation

## 2012-12-11 DIAGNOSIS — O99810 Abnormal glucose complicating pregnancy: Secondary | ICD-10-CM | POA: Insufficient documentation

## 2012-12-13 ENCOUNTER — Encounter: Payer: No Typology Code available for payment source | Admitting: Obstetrics and Gynecology

## 2012-12-20 ENCOUNTER — Observation Stay: Payer: Self-pay | Admitting: Obstetrics and Gynecology

## 2012-12-21 ENCOUNTER — Inpatient Hospital Stay: Payer: Self-pay | Admitting: Obstetrics and Gynecology

## 2012-12-21 LAB — CBC WITH DIFFERENTIAL/PLATELET
Basophil #: 0 10*3/uL (ref 0.0–0.1)
Basophil %: 0.4 %
Eosinophil #: 0.1 10*3/uL (ref 0.0–0.7)
Eosinophil %: 1.2 %
HCT: 35.5 % (ref 35.0–47.0)
HGB: 12.4 g/dL (ref 12.0–16.0)
Lymphocyte #: 1.7 10*3/uL (ref 1.0–3.6)
Lymphocyte %: 15.6 %
MCH: 30.6 pg (ref 26.0–34.0)
MCHC: 35 g/dL (ref 32.0–36.0)
MCV: 87 fL (ref 80–100)
Monocyte #: 0.6 x10 3/mm (ref 0.2–0.9)
Monocyte %: 5.9 %
Neutrophil #: 8.5 10*3/uL — ABNORMAL HIGH (ref 1.4–6.5)
Neutrophil %: 76.9 %
Platelet: 150 10*3/uL (ref 150–440)
RBC: 4.07 10*6/uL (ref 3.80–5.20)
RDW: 15 % — ABNORMAL HIGH (ref 11.5–14.5)
WBC: 11 10*3/uL (ref 3.6–11.0)

## 2012-12-22 LAB — HEMATOCRIT: HCT: 33.8 % — ABNORMAL LOW (ref 35.0–47.0)

## 2013-01-29 ENCOUNTER — Ambulatory Visit (INDEPENDENT_AMBULATORY_CARE_PROVIDER_SITE_OTHER): Payer: No Typology Code available for payment source | Admitting: Family Medicine

## 2013-01-29 VITALS — BP 119/75 | Temp 98.0°F | Wt 168.8 lb

## 2013-01-29 DIAGNOSIS — O99810 Abnormal glucose complicating pregnancy: Secondary | ICD-10-CM

## 2013-01-29 DIAGNOSIS — O24419 Gestational diabetes mellitus in pregnancy, unspecified control: Secondary | ICD-10-CM

## 2013-01-29 NOTE — Patient Instructions (Addendum)
Fue un Research officer, trade union.    Para la DIU, estamos mandando una solicitud para recibir el dispositivo directamente de la compania.  Le llamamos a marcar una cita encuanto la tengamos fisicamente en la oficina.  Le estamos marcando para hacer la prueba del azucar EN AYUNAS, en el proximo mes aqui en nuestra oficina, para asegurarnos de que la diabetes gestacional se haya quitado.  Le felicito por estar dando de pecho a [employer]!  LAB FASTING FOR 75GRAM 2HOUR GTT IN THE COMING 2 TO 4 WEEKS.  PATIENT COMPLETED FORM FOR MIRENA SCHOLARSHIP PROGRAM, WILL NEED TO HAVE IUD PLACEMENT SCHEDULED WITH PHYSICIAN OTHER THAN DR Mauricio Po FOR THIS WHEN AVAILABLE.

## 2013-01-30 NOTE — Progress Notes (Signed)
Visit conducted in Spanish.  Patient is here for her post-partum visit..  She delivered her son (BW 7#10oz) on June 6th ("Matthew"), she is breast feeding exclusively.  Feels well overall, no blues or depression.  Is a bit tired from lack of sleep that she attributes to baby care at night. Does not lack interest in things or tasks that she needs to accomplish.  She had some scant bleeding/lochia for 15 days after delivery but has not resumed normal menses.  Has resumed intercourse with husband and is using condoms reliably.  Wishes to have Mirena IUD placement, had a Mirena for 5 years before removing and becoming pregnant with her infant son.  She is a nonsmoker.  Wanda Powers was followed by HROB clinic at [facility] for GDM that was controlled by diet.  She is due for a 75gm 2hrGTT in the post-partum period.  Exam Well appearing, no apparent distress HEENT neck supple, no cervical adenopathy.  ABD Soft, nontender.  GU: Speculum exam with inspection of normal vaginal mucosa.  Nonfriable and without cervical discharge.  Bimanual exam without CMT; no adnexal or uterine masses or tenderness.  Assess/Plan: Patient is 6 week postpartum, breastfeeding, doing well. PHQ2 negative today. Wishes for Mirena IUD, which she has used before.  Wanda Powers has been made aware, patient is completing the Patient Assistance/Scholarship forms for this.  I am told (and have relayed to patient) that she will be called when the device is here for placement.  I have made her aware that I do not place IUD's personally, but she may be scheduled with another physician who can place the Mirena. [name], MD

## 2013-02-21 ENCOUNTER — Encounter: Payer: Self-pay | Admitting: Family Medicine

## 2013-02-21 ENCOUNTER — Ambulatory Visit (INDEPENDENT_AMBULATORY_CARE_PROVIDER_SITE_OTHER): Payer: No Typology Code available for payment source | Admitting: Family Medicine

## 2013-02-21 VITALS — BP 110/75 | HR 83 | Ht 64.0 in | Wt 165.0 lb

## 2013-02-21 DIAGNOSIS — Z309 Encounter for contraceptive management, unspecified: Secondary | ICD-10-CM

## 2013-02-21 LAB — POCT URINE PREGNANCY: Preg Test, Ur: NEGATIVE

## 2013-02-21 MED ORDER — LEVONORGESTREL 20 MCG/24HR IU IUD
INTRAUTERINE_SYSTEM | Freq: Once | INTRAUTERINE | Status: AC
  Administered 2013-02-21: 17:00:00 via INTRAUTERINE

## 2013-02-21 NOTE — Progress Notes (Signed)
Patient ID: Wanda Powers, female   DOB: May 28, 1976, 37 y.o.   MRN: [id number]  Placement of Mirena IUD  Indication: Contraception Assisted & supervised by: Dr. Lum Babe  Patient was counseled regarding the risks/benefits/alternatives of the IUD. Urine pregnancy test negative. Consent was obtained and all questions answered.  Time out performed.  Mirena Lot # [id number]  Description: Cervix was swabbed three times with Betadine swabs. Sterile gloves donned. Sterile single-tooth tenaculum used to grasp anterior lip of the cervix and straighten the endocervical canal. Uterus sounded to 7 cm. IUD loaded per manufacturer's instruction and flange set to 7 cm. IUD placed per manufacturer's directions. Strings trimmed to 4 cm. Patient tolerated the procedure well, with only mild cramping.  Follow-up: Patient given handout regarding IUD after-care. Patient notified of removal date and given card and manufacturer's patient education guide. Patient will follow-up in 2 weeks for string check.

## 2013-02-21 NOTE — Patient Instructions (Signed)
Come back in 2 weeks for a string re-check.  Colocacin de un dispositivo intrauterino - Cuidados posteriores  (Intrauterine Device Insertion, Care After) Siga estas instrucciones durante las prximas semanas. Estas indicaciones le proporcionan informacin general acerca de cmo deber cuidarse despus del procedimiento. El mdico tambin podr darle instrucciones ms especficas. El tratamiento se ha planificado de acuerdo a las prcticas mdicas actuales, pero a veces se producen problemas. Comunquese con el mdico si tiene algn problema o si tiene preguntas despus del procedimiento.  INSTRUCCIONES PARA EL CUIDADO EN EL HOGAR   Solo tome medicamentos de venta libre o recetados para Chief Technology Officer, Dentist o fiebre, segn las indicaciones del mdico. No tome aspirina. Esto puede aumentar el sangrado.  Revise su DIU para asegurarse de que est en su lugar antes de reanudar la actividad sexual. Tiene que sentir los hilos. Si no los siente, algo puede estar mal. El DIU puede haberse salido del tero o ste puede haber sido atravesado (perforado) durante la colocacin. Adems, si los hilos son ms largos, puede significar que el DIU se est saliendo del tero. No tendr proteccin para evitar el embarazo si ocurre alguno de [employer].  Puede volver a Management consultant si no tiene problemas con el DIU. El DIU se considera efectivo de inmediato.  Puede retomar sus actividades habituales.  Cumpla con todos los controles para asegurarse de que su DIU se ha Freight forwarder. Despus del Risk manager, se recomienda realizar controles anuales, a menos que usted no pueda sentir los hilos de su DIU.  Controle que el DIU sigue en su lugar sintiendo los hilos despus de cada perodo menstrual. SOLICITE ATENCIN MDICA SI:   Tiene un sangrado ms abundante o dura ms de un ciclo menstrual normal.  Tiene fiebre.  Siente clicos o dolor abdominal que no se alivian con medicamentos.  Siente  dolor abdominal que no parece estar relacionado con el rea en que senta los clicos y Chief Technology Officer anteriormente.  Se siente mareada, inusualmente dbil o se desmaya.  Tiene flujo vaginal u olores anormales.  Tiene dolor durante las [employer].  No puede sentir los hilos del DIU o los hilos del DIU se han alargado.  Siente que el DIU est en la abertura del cuello del tero, en la vagina.  Piensa que est embarazada o no tiene su perodo menstrual.  El hilo del DIU est lastimando a su pareja sexual. Document Released: 03/28/2012 ExitCare Patient Information 2014 [place], [place].

## 2013-02-22 ENCOUNTER — Telehealth: Payer: Self-pay | Admitting: Family Medicine

## 2013-02-22 ENCOUNTER — Ambulatory Visit: Payer: No Typology Code available for payment source | Admitting: Family Medicine

## 2013-02-22 NOTE — Telephone Encounter (Signed)
Called patient using phone interpreter, to check on how she is doing after having the IUD put in yesterday. She is doing well. Advised her to use a condom for the next 7 days to prevent pregnancy, after which the IUD will be effective. Patient understood.  Latrelle Dodrill, MD

## 2013-03-07 ENCOUNTER — Ambulatory Visit: Payer: No Typology Code available for payment source | Admitting: Family Medicine

## 2013-04-03 ENCOUNTER — Encounter (HOSPITAL_COMMUNITY): Payer: Self-pay | Admitting: *Deleted

## 2013-05-17 ENCOUNTER — Encounter: Payer: Self-pay | Admitting: Family Medicine

## 2013-05-17 ENCOUNTER — Ambulatory Visit (INDEPENDENT_AMBULATORY_CARE_PROVIDER_SITE_OTHER): Payer: No Typology Code available for payment source | Admitting: Family Medicine

## 2013-05-17 VITALS — BP 115/81 | HR 76 | Temp 98.2°F | Ht 64.0 in | Wt 166.2 lb

## 2013-05-17 DIAGNOSIS — J069 Acute upper respiratory infection, unspecified: Secondary | ICD-10-CM

## 2013-05-17 NOTE — Progress Notes (Signed)
  Subjective:    Patient ID: Wanda Powers, female    DOB: [birth year], 37 y.o.   MRN: [id number]  HPI  Visit conducted in Spanish.  [name] presents with complaint of 2 days of cough productive of yellow non-bloody phlegm; and mild sore throat.  No fevers or chills, has had some mild nausea but no diarrhea or emesis.  No sick contacts.  She is breastfeeding her 44 month old son, who is well and not showing any signs of illness.      Review of Systems  See HPI      Objective:   Physical Exam Generally well appearing, no apparent distress.  HEENT Neck supple, no cervical adenopathy. TMs clear; no frontal or maxillary sinus tenderness. Mildly erythematous oropharynx without exudates. Injected conjunctivae. Moist mucus membranes. COR Regular S1S2, no extra sounds PULM Clear bilaterally without rales or wheezes.       Assessment & Plan:

## 2013-05-17 NOTE — Patient Instructions (Signed)
Fue un placer verle hoy; creo que tienes un catarro comun.  Lo mas importante es que [employer] la higiene con mucho rigor (lavandose las manos cada vez que atiendes al [place], tapandose la boca al toser o Engineering geologist).    Puede usar [employer] 600mg , una tableta por boca cada 12 horas, para hacer menos espesa la flema.  Tomar bastante agua.  Vamos a tratar de [employer] cita con la enfermera para que el reciba las vacunas de los cuatro meses antes de su cita conmigo.  PLEASE SCHEDULE PATIENT'S SON WITH RN CLINIC FOR HIS 4 MONTH VACCINES ASAP, BEFORE HIS SCHEDULED WCC WITH DR Mauricio Po.

## 2013-08-02 ENCOUNTER — Encounter: Payer: Self-pay | Admitting: Family Medicine

## 2013-08-02 DIAGNOSIS — L819 Disorder of pigmentation, unspecified: Secondary | ICD-10-CM

## 2013-08-02 NOTE — Progress Notes (Signed)
Patient ID: Wanda Powers, female   DOB: 1975-12-19, 38 y.o.   MRN: [id number] Patient here accompanying her infant son for his well-child check; she has had hypopigmented skin lesions for the past 4 years, not related to pregnancy.  She has identified a Paediatric nurse who practices at the [facility] in [place] whom she can see for this.  To place referral for this.  JB

## 2013-11-12 ENCOUNTER — Other Ambulatory Visit: Payer: Self-pay | Admitting: Family Medicine

## 2013-11-12 MED ORDER — NAPROXEN 500 MG PO TABS: 500.0000 mg | ORAL_TABLET | Freq: Two times a day (BID) | ORAL | Status: DC

## 2014-05-19 ENCOUNTER — Encounter: Payer: Self-pay | Admitting: Family Medicine

## 2014-09-02 ENCOUNTER — Encounter: Payer: No Typology Code available for payment source | Admitting: Family Medicine

## 2014-09-09 ENCOUNTER — Encounter: Payer: Self-pay | Admitting: Family Medicine

## 2014-09-09 ENCOUNTER — Ambulatory Visit (INDEPENDENT_AMBULATORY_CARE_PROVIDER_SITE_OTHER): Payer: Self-pay | Admitting: Family Medicine

## 2014-09-09 VITALS — BP 125/69 | HR 96 | Temp 98.1°F | Ht 64.0 in | Wt 148.0 lb

## 2014-09-09 DIAGNOSIS — G44209 Tension-type headache, unspecified, not intractable: Secondary | ICD-10-CM | POA: Insufficient documentation

## 2014-09-09 MED ORDER — NORTRIPTYLINE HCL 25 MG PO CAPS: 25.0000 mg | ORAL_CAPSULE | Freq: Every day | ORAL | Status: DC

## 2014-09-09 NOTE — Patient Instructions (Signed)
Fue un Civil Service fast streamer.  Creo que los dolores de [place] se deben a tension muscular.  Estoy mandando una receta de una medicina que se llama NORTRIPTYLINE 25mg , UNA POR BOCA 30 MINUTOS ANTES DE DORMIR.  FOLLOW UP VISIT WITH DR Lindell Noe 2 to 4 WEEKS.

## 2014-09-10 NOTE — Assessment & Plan Note (Signed)
Headache pain in the back of the head, neck contiguous with trapezius and radiating up to crown.  No associated symptoms of migraine; no photophobia or nausea/vomiting.  Suspect tension-type headaches, has not taken any meds for this. Undergoing a lot of stress with adolescent son whom she recently learned has been using marijuana and skipping school.  Discussed use of nortriptyline 25mg  at bedtime, use of heat pads.  Follow up in 2 to 4 weeks or sooner if worsening.

## 2014-09-10 NOTE — Progress Notes (Signed)
   Subjective:    Patient ID: Wanda Powers, female    DOB: April 30, 1976, 39 y.o.   MRN: [id number]  HPIVisit conducted in [place].  Patient reports she has had intermittent occipital headaches that begin in the shoulders, radiate up to the crown of her head, on and off for the past 6 months. She began with these HAs at about the time when she discovered that her 29 year old son was truant from school and was using marijuana while living with her ex-husband (his father) in [place] he came back to her house here in [place] to live, and he will begin Fuig 11th grade classes.    Headaches not associated with activity. Not related to time of day or menstrual cycle; no photophobia, no N/V, no motor weakness. No syncope.  She has not taken any otc meds for these headaches. No historical migraine history. Do not wake her from sleep. No sinus congestion, no recent viral URI symptoms.    Review of Systems     Objective:   Physical Exam Well appearing, no apparent distress HEENT Neck supple, no cervical adenopathy. PERRL. EOMI. Tms clear. No frontal or maxillary sinus tenderness.  Mild tenderness to palpate along bilateral temporal areas and hatband distribution.  COR Regular S1S2 PULM Clear bilaterally, no rales or wheezes       Assessment & Plan:

## 2014-10-13 ENCOUNTER — Emergency Department (HOSPITAL_COMMUNITY)
Admission: EM | Admit: 2014-10-13 | Discharge: 2014-10-13 | Disposition: A | Discharge status: Home / Self Care | Payer: No Typology Code available for payment source | Attending: Emergency Medicine | Admitting: Emergency Medicine

## 2014-10-13 ENCOUNTER — Encounter (HOSPITAL_COMMUNITY): Payer: Self-pay | Admitting: Emergency Medicine

## 2014-10-13 ENCOUNTER — Emergency Department (HOSPITAL_COMMUNITY): Payer: No Typology Code available for payment source

## 2014-10-13 DIAGNOSIS — Z8632 Personal history of gestational diabetes: Secondary | ICD-10-CM | POA: Insufficient documentation

## 2014-10-13 DIAGNOSIS — D135 Benign neoplasm of extrahepatic bile ducts: Secondary | ICD-10-CM

## 2014-10-13 DIAGNOSIS — Z87448 Personal history of other diseases of urinary system: Secondary | ICD-10-CM | POA: Insufficient documentation

## 2014-10-13 DIAGNOSIS — Z79899 Other long term (current) drug therapy: Secondary | ICD-10-CM | POA: Insufficient documentation

## 2014-10-13 DIAGNOSIS — K838 Other specified diseases of biliary tract: Secondary | ICD-10-CM | POA: Insufficient documentation

## 2014-10-13 DIAGNOSIS — Z3202 Encounter for pregnancy test, result negative: Secondary | ICD-10-CM | POA: Insufficient documentation

## 2014-10-13 DIAGNOSIS — R109 Unspecified abdominal pain: Secondary | ICD-10-CM

## 2014-10-13 LAB — CBC WITH DIFFERENTIAL/PLATELET
Basophils Absolute: 0 10*3/uL (ref 0.0–0.1)
Basophils Relative: 0 % (ref 0–1)
Eosinophils Absolute: 0.2 10*3/uL (ref 0.0–0.7)
Eosinophils Relative: 2 % (ref 0–5)
HCT: 39.1 % (ref 36.0–46.0)
Hemoglobin: 13.4 g/dL (ref 12.0–15.0)
Lymphocytes Relative: 22 % (ref 12–46)
Lymphs Abs: 2.2 10*3/uL (ref 0.7–4.0)
MCH: 30.2 pg (ref 26.0–34.0)
MCHC: 34.3 g/dL (ref 30.0–36.0)
MCV: 88.1 fL (ref 78.0–100.0)
Monocytes Absolute: 0.5 10*3/uL (ref 0.1–1.0)
Monocytes Relative: 5 % (ref 3–12)
Neutro Abs: 7.1 10*3/uL (ref 1.7–7.7)
Neutrophils Relative %: 71 % (ref 43–77)
Platelets: 213 10*3/uL (ref 150–400)
RBC: 4.44 MIL/uL (ref 3.87–5.11)
RDW: 12.8 % (ref 11.5–15.5)
WBC: 10 10*3/uL (ref 4.0–10.5)

## 2014-10-13 LAB — COMPREHENSIVE METABOLIC PANEL
ALT: 25 U/L (ref 0–35)
AST: 22 U/L (ref 0–37)
Albumin: 3.7 g/dL (ref 3.5–5.2)
Alkaline Phosphatase: 56 U/L (ref 39–117)
Anion gap: 9 (ref 5–15)
BUN: 15 mg/dL (ref 6–23)
CO2: 25 mmol/L (ref 19–32)
Calcium: 9.2 mg/dL (ref 8.4–10.5)
Chloride: 100 mmol/L (ref 96–112)
Creatinine, Ser: 0.57 mg/dL (ref 0.50–1.10)
GFR calc Af Amer: >90 mL/min (ref >90)
GFR calc non Af Amer: >90 mL/min (ref >90)
Glucose, Bld: 102 mg/dL — ABNORMAL HIGH (ref 70–99)
Potassium: 4.3 mmol/L (ref 3.5–5.1)
Sodium: 134 mmol/L — ABNORMAL LOW (ref 135–145)
Total Bilirubin: 0.6 mg/dL (ref 0.3–1.2)
Total Protein: 6.5 g/dL (ref 6.0–8.3)

## 2014-10-13 LAB — URINALYSIS, ROUTINE W REFLEX MICROSCOPIC
Bilirubin Urine: NEGATIVE
Glucose, UA: NEGATIVE mg/dL
Hgb urine dipstick: NEGATIVE
Ketones, ur: NEGATIVE mg/dL
Leukocytes, UA: NEGATIVE
Nitrite: NEGATIVE
Protein, ur: NEGATIVE mg/dL
Specific Gravity, Urine: 1.011 (ref 1.005–1.030)
Urobilinogen, UA: 0.2 mg/dL (ref 0.0–1.0)
pH: 7 (ref 5.0–8.0)

## 2014-10-13 LAB — LIPASE, BLOOD: Lipase: 24 U/L (ref 11–59)

## 2014-10-13 LAB — POC URINE PREG, ED: Preg Test, Ur: NEGATIVE

## 2014-10-13 MED ORDER — SODIUM CHLORIDE 0.9 % IV SOLN
1000.0000 mL | Freq: Once | INTRAVENOUS | Status: AC
  Administered 2014-10-13: 1000 mL via INTRAVENOUS

## 2014-10-13 MED ORDER — ONDANSETRON HCL 4 MG/2ML IJ SOLN
4.0000 mg | Freq: Once | INTRAMUSCULAR | Status: AC
  Administered 2014-10-13: 4 mg via INTRAVENOUS
  Filled 2014-10-13: qty 2

## 2014-10-13 MED ORDER — MORPHINE SULFATE 4 MG/ML IJ SOLN
4.0000 mg | Freq: Once | INTRAMUSCULAR | Status: AC
  Administered 2014-10-13: 4 mg via INTRAVENOUS
  Filled 2014-10-13: qty 1

## 2014-10-13 MED ORDER — OXYCODONE-ACETAMINOPHEN 5-325 MG PO TABS: 1.0000 | ORAL_TABLET | ORAL | Status: DC | PRN

## 2014-10-13 MED ORDER — ONDANSETRON HCL 4 MG PO TABS: 4.0000 mg | ORAL_TABLET | Freq: Four times a day (QID) | ORAL | Status: DC | PRN

## 2014-10-13 MED ORDER — SODIUM CHLORIDE 0.9 % IV SOLN
1000.0000 mL | INTRAVENOUS | Status: DC
  Administered 2014-10-13: 1000 mL via INTRAVENOUS

## 2014-10-13 NOTE — Discharge Instructions (Signed)
Your ultrasound showed no gallstones, but your common bile duct was dilated. Another test dates to be done to evaluate this-MRCP. This needs to be set up through your primary care provider. Please make an appointment to see your doctor in the next several days.   Abdominal Pain Many things can cause abdominal pain. Usually, abdominal pain is not caused by a disease and will improve without treatment. It can often be observed and treated at home. Your health care provider will do a physical exam and possibly order blood tests and X-rays to help determine the seriousness of your pain. However, in many cases, more time must pass before a clear cause of the pain can be found. Before that point, your health care provider may not know if you need more testing or further treatment. HOME CARE INSTRUCTIONS  Monitor your abdominal pain for any changes. The following actions may help to alleviate any discomfort you are experiencing:  Only take over-the-counter or prescription medicines as directed by your health care provider.  Do not take laxatives unless directed to do so by your health care provider.  Try a clear liquid diet (broth, tea, or water) as directed by your health care provider. Slowly move to a bland diet as tolerated. SEEK MEDICAL CARE IF:  You have unexplained abdominal pain.  You have abdominal pain associated with nausea or diarrhea.  You have pain when you urinate or have a bowel movement.  You experience abdominal pain that wakes you in the night.  You have abdominal pain that is worsened or improved by eating food.  You have abdominal pain that is worsened with eating fatty foods.  You have a fever. SEEK IMMEDIATE MEDICAL CARE IF:   Your pain does not go away within 2 hours.  You keep throwing up (vomiting).  Your pain is felt only in portions of the abdomen, such as the right side or the left lower portion of the abdomen.  You pass bloody or black tarry stools. MAKE  SURE YOU:  Understand these instructions.   Will watch your condition.   Will get help right away if you are not doing well or get worse.  Document Released: 04/13/2005 Document Revised: 07/09/2013 Document Reviewed: 03/13/2013 [facility] Patient Information 2015 [place], [place]. This information is not intended to replace advice given to you by your health care provider. Make sure you discuss any questions you have with your health care provider.  Acetaminophen; Oxycodone tablets What is this medicine? ACETAMINOPHEN; OXYCODONE (a set a MEE noe fen; ox i KOE done) is a pain reliever. It is used to treat mild to moderate pain. This medicine may be used for other purposes; ask your health care provider or pharmacist if you have questions. COMMON BRAND NAME(S): Endocet, Magnacet, Narvox, Percocet, Perloxx, Primalev, Primlev, Roxicet, Xolox What should I tell my health care provider before I take this medicine? They need to know if you have any of these conditions: -brain tumor -Crohn's disease, inflammatory bowel disease, or ulcerative colitis -drug abuse or addiction -head injury -heart or circulation problems -if you often drink alcohol -kidney disease or problems going to the bathroom -liver disease -lung disease, asthma, or breathing problems -an unusual or allergic reaction to acetaminophen, oxycodone, other opioid analgesics, other medicines, foods, dyes, or preservatives -pregnant or trying to get pregnant -breast-feeding How should I use this medicine? Take this medicine by mouth with a full glass of water. Follow the directions on the prescription label. Take your medicine at regular intervals.  Do not take your medicine more often than directed. Talk to your pediatrician regarding the use of this medicine in children. Special care may be needed. Patients over 31 years old may have a stronger reaction and need a smaller dose. Overdosage: If you think you have taken too much of  this medicine contact a poison control center or emergency room at once. NOTE: This medicine is only for you. Do not share this medicine with others. What if I miss a dose? If you miss a dose, take it as soon as you can. If it is almost time for your next dose, take only that dose. Do not take double or extra doses. What may interact with this medicine? -alcohol -antihistamines -barbiturates like amobarbital, butalbital, butabarbital, methohexital, pentobarbital, phenobarbital, thiopental, and secobarbital -benztropine -drugs for bladder problems like solifenacin, trospium, oxybutynin, tolterodine, hyoscyamine, and methscopolamine -drugs for breathing problems like ipratropium and tiotropium -drugs for certain stomach or intestine problems like propantheline, homatropine methylbromide, glycopyrrolate, atropine, belladonna, and dicyclomine -general anesthetics like etomidate, ketamine, nitrous oxide, propofol, desflurane, enflurane, halothane, isoflurane, and sevoflurane -medicines for depression, anxiety, or psychotic disturbances -medicines for sleep -muscle relaxants -naltrexone -narcotic medicines (opiates) for pain -phenothiazines like perphenazine, thioridazine, chlorpromazine, mesoridazine, fluphenazine, prochlorperazine, promazine, and trifluoperazine -scopolamine -tramadol -trihexyphenidyl This list may not describe all possible interactions. Give your health care provider a list of all the medicines, herbs, non-prescription drugs, or dietary supplements you use. Also tell them if you smoke, drink alcohol, or use illegal drugs. Some items may interact with your medicine. What should I watch for while using this medicine? Tell your doctor or health care professional if your pain does not go away, if it gets worse, or if you have new or a different type of pain. You may develop tolerance to the medicine. Tolerance means that you will need a higher dose of the medication for pain relief.  Tolerance is normal and is expected if you take this medicine for a long time. Do not suddenly stop taking your medicine because you may develop a severe reaction. Your body becomes used to the medicine. This does NOT mean you are addicted. Addiction is a behavior related to getting and using a drug for a non-medical reason. If you have pain, you have a medical reason to take pain medicine. Your doctor will tell you how much medicine to take. If your doctor wants you to stop the medicine, the dose will be slowly lowered over time to avoid any side effects. You may get drowsy or dizzy. Do not drive, use machinery, or do anything that needs mental alertness until you know how this medicine affects you. Do not stand or sit up quickly, especially if you are an older patient. This reduces the risk of dizzy or fainting spells. Alcohol may interfere with the effect of this medicine. Avoid alcoholic drinks. There are different types of narcotic medicines (opiates) for pain. If you take more than one type at the same time, you may have more side effects. Give your health care provider a list of all medicines you use. Your doctor will tell you how much medicine to take. Do not take more medicine than directed. Call emergency for help if you have problems breathing. The medicine will cause constipation. Try to have a bowel movement at least every 2 to 3 days. If you do not have a bowel movement for 3 days, call your doctor or health care professional. Do not take Tylenol (acetaminophen) or medicines that have acetaminophen  with this medicine. Too much acetaminophen can be very dangerous. Many nonprescription medicines contain acetaminophen. Always read the labels carefully to avoid taking more acetaminophen. What side effects may I notice from receiving this medicine? Side effects that you should report to your doctor or health care professional as soon as possible: -allergic reactions like skin rash, itching or hives,  swelling of the face, lips, or tongue -breathing difficulties, wheezing -confusion -light headedness or fainting spells -severe stomach pain -unusually weak or tired -yellowing of the skin or the whites of the eyes Side effects that usually do not require medical attention (report to your doctor or health care professional if they continue or are bothersome): -dizziness -drowsiness -nausea -vomiting This list may not describe all possible side effects. Call your doctor for medical advice about side effects. You may report side effects to FDA at 1-800-FDA-1088. Where should I keep my medicine? Keep out of the reach of children. This medicine can be abused. Keep your medicine in a safe place to protect it from theft. Do not share this medicine with anyone. Selling or giving away this medicine is dangerous and against the law. Store at room temperature between 20 and 25 degrees C (68 and 77 degrees F). Keep container tightly closed. Protect from light. This medicine may cause accidental overdose and death if it is taken by other adults, children, or pets. Flush any unused medicine down the toilet to reduce the chance of harm. Do not use the medicine after the expiration date. NOTE: This sheet is a summary. It may not cover all possible information. If you have questions about this medicine, talk to your doctor, pharmacist, or health care provider.  2015, [employer] Standard. (2013-02-25 13:17:35)  Ondansetron tablets What is this medicine? ONDANSETRON (on DAN se tron) is used to treat nausea and vomiting caused by chemotherapy. It is also used to prevent or treat nausea and vomiting after surgery. This medicine may be used for other purposes; ask your health care provider or pharmacist if you have questions. COMMON BRAND NAME(S): Zofran What should I tell my health care provider before I take this medicine? They need to know if you have any of these conditions: -heart disease -history of  irregular heartbeat -liver disease -low levels of magnesium or potassium in the blood -an unusual or allergic reaction to ondansetron, granisetron, other medicines, foods, dyes, or preservatives -pregnant or trying to get pregnant -breast-feeding How should I use this medicine? Take this medicine by mouth with a glass of water. Follow the directions on your prescription label. Take your doses at regular intervals. Do not take your medicine more often than directed. Talk to your pediatrician regarding the use of this medicine in children. Special care may be needed. Overdosage: If you think you have taken too much of this medicine contact a poison control center or emergency room at once. NOTE: This medicine is only for you. Do not share this medicine with others. What if I miss a dose? If you miss a dose, take it as soon as you can. If it is almost time for your next dose, take only that dose. Do not take double or extra doses. What may interact with this medicine? Do not take this medicine with any of the following medications: -apomorphine -certain medicines for fungal infections like fluconazole, itraconazole, ketoconazole, posaconazole, voriconazole -cisapride -dofetilide -dronedarone -pimozide -thioridazine -ziprasidone This medicine may also interact with the following medications: -carbamazepine -certain medicines for depression, anxiety, or psychotic disturbances -fentanyl -linezolid -MAOIs  like Carbex, Eldepryl, Marplan, Nardil, and Parnate -methylene blue (injected into a vein) -other medicines that prolong the QT interval (cause an abnormal heart rhythm) -phenytoin -rifampicin -tramadol This list may not describe all possible interactions. Give your health care provider a list of all the medicines, herbs, non-prescription drugs, or dietary supplements you use. Also tell them if you smoke, drink alcohol, or use illegal drugs. Some items may interact with your  medicine. What should I watch for while using this medicine? Check with your doctor or health care professional right away if you have any sign of an allergic reaction. What side effects may I notice from receiving this medicine? Side effects that you should report to your doctor or health care professional as soon as possible: -allergic reactions like skin rash, itching or hives, swelling of the face, lips or tongue -breathing problems -confusion -dizziness -fast or irregular heartbeat -feeling faint or lightheaded, falls -fever and chills -loss of balance or coordination -seizures -sweating -swelling of the hands or feet -tightness in the chest -tremors -unusually weak or tired Side effects that usually do not require medical attention (report to your doctor or health care professional if they continue or are bothersome): -constipation or diarrhea -headache This list may not describe all possible side effects. Call your doctor for medical advice about side effects. You may report side effects to FDA at 1-800-FDA-1088. Where should I keep my medicine? Keep out of the reach of children. Store between 2 and 30 degrees C (36 and 86 degrees F). Throw away any unused medicine after the expiration date. NOTE: This sheet is a summary. It may not cover all possible information. If you have questions about this medicine, talk to your doctor, pharmacist, or health care provider.  2015, [employer] Standard. (2013-04-10 16:27:45)

## 2014-10-13 NOTE — ED Notes (Signed)
Pt reports generalized abdominal pain, L hip pain and back pain that has been intermittent for several months. Denies nvd, fevers/chills.

## 2014-10-13 NOTE — ED Provider Notes (Signed)
CSN: [id number]     Arrival date & time 10/13/14  0016 History  This chart was scribed for Delora Fuel, MD by Molli Posey, ED Scribe. This patient was seen in room A06C/A06C and the patient's care was started 12:34 AM.  Chief Complaint  Patient presents with  . Abdominal Pain   The history is provided by the patient. No language interpreter was used.   HPI Comments: Wanda Powers is a 39 y.o. female with a history of pyelonephritis who presents to the Emergency Department complaining of generalized abdominal pain that worsened an hour ago. She rates her pain as a 8/10 at this time. Pt states that she has experienced 3 episodes of similar abdominal pain in the last 3 months but states that her current episode is the most severe. She says she is unsure if her other episodes were triggered by eating. Pt reports that her pain worsened tonight after she ate chicken salad. Pt reports no alleviating factors at this time. She denies nausea.    Past Medical History  Diagnosis Date  . Pyelonephritis 1996  . Gestational diabetes    Past Surgical History  Procedure Laterality Date  . Cesarean section     Family History  Problem Relation Age of Onset  . Hyperlipidemia Mother   . Hypertension Mother   . Cancer Mother   . Heart attack Neg Hx   . Diabetes Neg Hx   . Sudden death Neg Hx    History  Substance Use Topics  . Smoking status: Never Smoker   . Smokeless tobacco: Not on file  . Alcohol Use: No   OB History    Gravida Para Term Preterm AB TAB SAB Ectopic Multiple Living   4 3 3       3      Review of Systems  Constitutional: Negative for fever and chills.  Gastrointestinal: Positive for abdominal pain. Negative for nausea, vomiting and diarrhea.  All other systems reviewed and are negative.     Allergies  Review of patient's allergies indicates no known allergies.  Home Medications   Prior to Admission medications   Medication Sig Start Date End Date Taking?  Authorizing Provider  folic acid (FOLVITE) 825 MCG tablet Take 400 mcg by mouth daily.    Historical Provider, MD  nortriptyline (PAMELOR) 25 MG capsule Take 1 capsule (25 mg total) by mouth at bedtime. 09/09/14   Willeen Niece, MD  prenatal vitamin w/FE, FA (PRENATAL 1 + 1) 27-1 MG TABS Take 1 tablet by mouth daily.    Historical Provider, MD   BP 113/74 mmHg  Pulse 81  Temp(Src) 98.1 F (36.7 C) (Oral)  Resp 16  Ht 5\' 4"  (1.626 m)  Wt 153 lb 11.2 oz (69.718 kg)  BMI 26.37 kg/m2  SpO2 98%  LMP 09/22/2014 Physical Exam  Constitutional: She is oriented to person, place, and time. She appears well-developed and well-nourished.  HENT:  Head: Normocephalic and atraumatic.  Eyes: EOM are normal. Pupils are equal, round, and reactive to light. Right eye exhibits no discharge. Left eye exhibits no discharge. No scleral icterus.  Neck: Normal range of motion. Neck supple. No JVD present.  Cardiovascular: Normal rate, regular rhythm and normal heart sounds.   No murmur heard. Pulmonary/Chest: Effort normal and breath sounds normal. She has no wheezes. She has no rales. She exhibits no tenderness.  Abdominal: Soft. Bowel sounds are normal. She exhibits no distension and no mass. There is tenderness. There is no guarding.  Moderate RUQ tenderness with Murphy's sign.   Musculoskeletal: Normal range of motion. She exhibits no edema.  Lymphadenopathy:    She has no cervical adenopathy.  Neurological: She is alert and oriented to person, place, and time. No cranial nerve deficit. Coordination normal.  Skin: Skin is warm and dry. No rash noted.  Psychiatric: She has a normal mood and affect. Her behavior is normal. Judgment and thought content normal.  Nursing note and vitals reviewed.   ED Course  Procedures   DIAGNOSTIC STUDIES: Oxygen Saturation is 98% on RA, normal by my interpretation.    COORDINATION OF CARE: 12:40 AM Discussed treatment plan with pt at bedside and pt agreed to  plan.   Labs Review Results for orders placed or performed during the hospital encounter of 10/13/14  Comprehensive metabolic panel  Result Value Ref Range   Sodium 134 (L) 135 - 145 mmol/L   Potassium 4.3 3.5 - 5.1 mmol/L   Chloride 100 96 - 112 mmol/L   CO2 25 19 - 32 mmol/L   Glucose, Bld 102 (H) 70 - 99 mg/dL   BUN 15 6 - 23 mg/dL   Creatinine, Ser 0.57 0.50 - 1.10 mg/dL   Calcium 9.2 8.4 - 10.5 mg/dL   Total Protein 6.5 6.0 - 8.3 g/dL   Albumin 3.7 3.5 - 5.2 g/dL   AST 22 0 - 37 U/L   ALT 25 0 - 35 U/L   Alkaline Phosphatase 56 39 - 117 U/L   Total Bilirubin 0.6 0.3 - 1.2 mg/dL   GFR calc non Af Amer >90 >90 mL/min   GFR calc Af Amer >90 >90 mL/min   Anion gap 9 5 - 15  Lipase, blood  Result Value Ref Range   Lipase 24 11 - 59 U/L  CBC with Differential  Result Value Ref Range   WBC 10.0 4.0 - 10.5 K/uL   RBC 4.44 3.87 - 5.11 MIL/uL   Hemoglobin 13.4 12.0 - 15.0 g/dL   HCT 39.1 36.0 - 46.0 %   MCV 88.1 78.0 - 100.0 fL   MCH 30.2 26.0 - 34.0 pg   MCHC 34.3 30.0 - 36.0 g/dL   RDW 12.8 11.5 - 15.5 %   Platelets 213 150 - 400 K/uL   Neutrophils Relative % 71 43 - 77 %   Neutro Abs 7.1 1.7 - 7.7 K/uL   Lymphocytes Relative 22 12 - 46 %   Lymphs Abs 2.2 0.7 - 4.0 K/uL   Monocytes Relative 5 3 - 12 %   Monocytes Absolute 0.5 0.1 - 1.0 K/uL   Eosinophils Relative 2 0 - 5 %   Eosinophils Absolute 0.2 0.0 - 0.7 K/uL   Basophils Relative 0 0 - 1 %   Basophils Absolute 0.0 0.0 - 0.1 K/uL  Urinalysis, Routine w reflex microscopic  Result Value Ref Range   Color, Urine YELLOW YELLOW   APPearance CLEAR CLEAR   Specific Gravity, Urine 1.011 1.005 - 1.030   pH 7.0 5.0 - 8.0   Glucose, UA NEGATIVE NEGATIVE mg/dL   Hgb urine dipstick NEGATIVE NEGATIVE   Bilirubin Urine NEGATIVE NEGATIVE   Ketones, ur NEGATIVE NEGATIVE mg/dL   Protein, ur NEGATIVE NEGATIVE mg/dL   Urobilinogen, UA 0.2 0.0 - 1.0 mg/dL   Nitrite NEGATIVE NEGATIVE   Leukocytes, UA NEGATIVE NEGATIVE  POC  urine preg, ED  Result Value Ref Range   Preg Test, Ur NEGATIVE NEGATIVE   Imaging Review US Abdomen Complete  10/13/2014  CLINICAL DATA:  Generalized abdominal pain. Three episodes of abdominal pain over the last 3 months, worse tonight.  EXAM: ULTRASOUND ABDOMEN COMPLETE  COMPARISON:  None.  FINDINGS: Gallbladder: Physiologically distended. No gallstones or wall thickening visualized. There in 2 foci of comet tail artifact measuring 2 mm within the gallbladder consistent with cholesterolosis. No sonographic Murphy sign noted.  Common bile duct: Diameter: Dilated measuring 11 mm at the porta hepatis.  Liver: No focal lesion identified. Within normal limits in parenchymal echogenicity.  IVC: No abnormality visualized.  Pancreas: Majority obscured by overlying bowel gas.  Spleen: Size and appearance within normal limits.  Right Kidney: Length: 11.0 cm. Echogenicity within normal limits. No mass or hydronephrosis visualized.  Left Kidney: Length: 11.6 cm. Echogenicity within normal limits. No mass or hydronephrosis visualized.  Abdominal aorta: No aneurysm visualized.  Other findings: None.  IMPRESSION: 1. Dilatation of the common bile duct measuring 11 mm at the porta hepatis. An obstructing lesion is not identified. If there is clinical concern for biliary obstruction, an MRCP could be considered. The gallbladder is physiologically distended. 2. Gallbladder adenomyomatosis.  No gallstones.   Electronically Signed   By: Jeb Levering M.D.   On: 10/13/2014 02:38    MDM   Final diagnoses:  Abdominal pain, unspecified abdominal location  Common bile duct dilatation  Adenomyoma, gallbladder    Abdominal pain with right upper quadrant tenderness suggestive of biliary colic. She'll be sent for ultrasound.  Ultrasound shows dilated common bile duct and had no myelomatosis of the gallbladder but no gallstones. Radiologist recommends MRCP, and this needs to be set up through her PCP. She is discharged  with prescriptions for ondansetron and oxycodone-acetaminophen and she is to make an appointment with her PCP to arrange outpatient MRCP.  I personally performed the services described in this documentation, which was scribed in my presence. The recorded information has been reviewed and is accurate.      Delora Fuel, MD 40/98/11 [id number]

## 2014-10-13 NOTE — ED Notes (Signed)
Pt ambulated to bathroom with steady gait. 

## 2014-10-17 ENCOUNTER — Ambulatory Visit (INDEPENDENT_AMBULATORY_CARE_PROVIDER_SITE_OTHER): Payer: Self-pay | Admitting: Family Medicine

## 2014-10-17 ENCOUNTER — Encounter: Payer: Self-pay | Admitting: Family Medicine

## 2014-10-17 VITALS — BP 104/71 | HR 62 | Temp 98.5°F | Ht 64.0 in | Wt 152.6 lb

## 2014-10-17 DIAGNOSIS — D135 Benign neoplasm of extrahepatic bile ducts: Secondary | ICD-10-CM

## 2014-10-17 DIAGNOSIS — K838 Other specified diseases of biliary tract: Secondary | ICD-10-CM

## 2014-10-17 DIAGNOSIS — Z9889 Other specified postprocedural states: Secondary | ICD-10-CM

## 2014-10-17 DIAGNOSIS — Z98891 History of uterine scar from previous surgery: Secondary | ICD-10-CM

## 2014-10-17 DIAGNOSIS — K8050 Calculus of bile duct without cholangitis or cholecystitis without obstruction: Secondary | ICD-10-CM

## 2014-10-17 HISTORY — DX: Calculus of bile duct without cholangitis or cholecystitis without obstruction: K80.50

## 2014-10-17 HISTORY — DX: Other specified diseases of biliary tract: K83.8

## 2014-10-17 HISTORY — DX: Benign neoplasm of extrahepatic bile ducts: D13.5

## 2014-10-17 NOTE — Patient Instructions (Signed)
Front desk: Please set pt up with appointment to see Pamala Hurry about renewing her orange card.  Thank you for coming in, today!  I want to refer you to the surgeons to talk about possibly having your gallbladder removed. They may or may not need to get the MRCP test. They may or may not even recommend surgery, but I think it is something they will consider.  Continue taking Tylenol for now, as needed. If your pain gets very bad, try the medicine from the emergency doctor. If it does not help or if things get worse, come back here or go back to the emergency room.  Talk to [facility] about getting your orange card renewed. The surgeon's office might want you to do all that before they schedule you an appointment. Come back to see [place] as you need, otherwise.  Please feel free to call with any questions or concerns at any time, at [phone]. --Dr. Venetia Maxon

## 2014-10-17 NOTE — Progress Notes (Signed)
   Subjective:    Patient ID: Wanda Powers, female    DOB: [birth year], 39 y.o.   MRN: [id number]  HPI: Pt presents to clinic for ED follow-up; she was seen for severe abdominal pain on 3/28. She has pain that comes and goes, sometimes with eating. She has no heartburn-type symptoms. Tylenol helps with her pain. Her pain is currently about a 3-4 / 10. 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 She has had no nausea or vomiting. Her appetite is okay. She has no change in her bowel movements. She has no unusual bleeding, either. Overall, she feels "okay" now but wants to do "whatever is recommended."  Of note, pt does not have insurance and her orange card needs to be renewed. She has to meet with Pamala Hurry to discuss this.  Review of Systems: As above.     Objective:   Physical Exam BP 104/71 mmHg  Pulse 62  Temp(Src) 98.5 F (36.9 C) (Oral)  Ht 5\' 4"  (1.626 m)  Wt 152 lb 9.6 oz (69.219 kg)  BMI 26.18 kg/m2  LMP 09/22/2014 Gen: well-appearing adult female in NAD HEENT: [place]/AT, EOMI, PERRLA, MMM  Posterior oropharynx clear, no tonsillar swelling Cardio: RRR, no murmur Pulm: CTAB, no wheezes Abd: soft, mild tenderness mostly in the RUQ and some periumbilically; BS normoactive  Equivocal Murphy sign, negative McBurney point tenderness Ext: warm, well-perfused  [place] reviewed; no cholelithiasis, no evidence for obstruction, with dilated common bile duct and adenomyomatosis     Assessment & Plan:  39yo female with RUQ pain, very likely related to gallbladder pathology - overall symptoms are consistent with biliary colic, but no history / exam findings suggest frank biliary obstruction - US findings suggest gallbladder as etiology of pain; pt is interested in either further imaging or surgical consultation  Plan: - hold on ordering MRCP for now (pt without insurance and doubt usefulness as she has no apparent clinical signs of obstruction) -  referred to surgery today for discussion for elective cholecystectomy; counseled that they may require her to get the orange card first or pay out of pocket for office consultation - strongly encouraged pt to set up appt to renew orange card ASAP - encouraged use of Tylenol for pain, good hydration, and avoidance of fatty / spicy food or heavy meals to reduce colicky symptoms - advised use of Norco prescribed by ED for very severe pain and to f/u in clinic or re-present to the ED if symptoms progress or worsen  Emmaline Kluver, MD PGY-3, [facility] 10/17/2014, 7:46 PM

## 2014-10-22 ENCOUNTER — Telehealth: Payer: Self-pay | Admitting: Family Medicine

## 2014-10-22 NOTE — Telephone Encounter (Signed)
Was seen in the ED and needs a referral to [place] surgery for gall bladder. Would like for PCP to call her because there weren't any available appts for her to see him before he leaves. / thanks [employer], ASA

## 2014-11-03 ENCOUNTER — Encounter (HOSPITAL_COMMUNITY): Payer: Self-pay | Admitting: *Deleted

## 2014-11-03 ENCOUNTER — Emergency Department (HOSPITAL_COMMUNITY): Payer: No Typology Code available for payment source

## 2014-11-03 ENCOUNTER — Emergency Department (HOSPITAL_COMMUNITY): Payer: Self-pay

## 2014-11-03 ENCOUNTER — Emergency Department (HOSPITAL_COMMUNITY)
Admission: EM | Admit: 2014-11-03 | Discharge: 2014-11-03 | Disposition: A | Discharge status: Home / Self Care | Payer: Self-pay | Attending: Emergency Medicine | Admitting: Emergency Medicine

## 2014-11-03 DIAGNOSIS — R1011 Right upper quadrant pain: Secondary | ICD-10-CM

## 2014-11-03 DIAGNOSIS — Z79899 Other long term (current) drug therapy: Secondary | ICD-10-CM | POA: Insufficient documentation

## 2014-11-03 DIAGNOSIS — K838 Other specified diseases of biliary tract: Secondary | ICD-10-CM

## 2014-11-03 DIAGNOSIS — Z8632 Personal history of gestational diabetes: Secondary | ICD-10-CM | POA: Insufficient documentation

## 2014-11-03 DIAGNOSIS — Z3202 Encounter for pregnancy test, result negative: Secondary | ICD-10-CM | POA: Insufficient documentation

## 2014-11-03 DIAGNOSIS — Z87448 Personal history of other diseases of urinary system: Secondary | ICD-10-CM | POA: Insufficient documentation

## 2014-11-03 LAB — CBC WITH DIFFERENTIAL/PLATELET
Basophils Absolute: 0.1 10*3/uL (ref 0.0–0.1)
Basophils Relative: 1 % (ref 0–1)
Eosinophils Absolute: 0.3 10*3/uL (ref 0.0–0.7)
Eosinophils Relative: 4 % (ref 0–5)
HCT: 39.6 % (ref 36.0–46.0)
Hemoglobin: 13.6 g/dL (ref 12.0–15.0)
Lymphocytes Relative: 34 % (ref 12–46)
Lymphs Abs: 2.4 10*3/uL (ref 0.7–4.0)
MCH: 30 pg (ref 26.0–34.0)
MCHC: 34.3 g/dL (ref 30.0–36.0)
MCV: 87.4 fL (ref 78.0–100.0)
Monocytes Absolute: 0.3 10*3/uL (ref 0.1–1.0)
Monocytes Relative: 5 % (ref 3–12)
Neutro Abs: 4 10*3/uL (ref 1.7–7.7)
Neutrophils Relative %: 56 % (ref 43–77)
Platelets: 209 10*3/uL (ref 150–400)
RBC: 4.53 MIL/uL (ref 3.87–5.11)
RDW: 12.9 % (ref 11.5–15.5)
WBC: 7 10*3/uL (ref 4.0–10.5)

## 2014-11-03 LAB — URINALYSIS, ROUTINE W REFLEX MICROSCOPIC
Bilirubin Urine: NEGATIVE
Glucose, UA: NEGATIVE mg/dL
Hgb urine dipstick: NEGATIVE
Ketones, ur: NEGATIVE mg/dL
Leukocytes, UA: NEGATIVE
Nitrite: NEGATIVE
Protein, ur: NEGATIVE mg/dL
Specific Gravity, Urine: 1.024 (ref 1.005–1.030)
Urobilinogen, UA: 0.2 mg/dL (ref 0.0–1.0)
pH: 6 (ref 5.0–8.0)

## 2014-11-03 LAB — COMPREHENSIVE METABOLIC PANEL
ALT: 18 U/L (ref 0–35)
AST: 19 U/L (ref 0–37)
Albumin: 4.3 g/dL (ref 3.5–5.2)
Alkaline Phosphatase: 42 U/L (ref 39–117)
Anion gap: 11 (ref 5–15)
BUN: 14 mg/dL (ref 6–23)
CO2: 24 mmol/L (ref 19–32)
Calcium: 9.3 mg/dL (ref 8.4–10.5)
Chloride: 102 mmol/L (ref 96–112)
Creatinine, Ser: 0.58 mg/dL (ref 0.50–1.10)
GFR calc Af Amer: >90 mL/min (ref >90)
GFR calc non Af Amer: >90 mL/min (ref >90)
Glucose, Bld: 107 mg/dL — ABNORMAL HIGH (ref 70–99)
Potassium: 4.5 mmol/L (ref 3.5–5.1)
Sodium: 137 mmol/L (ref 135–145)
Total Bilirubin: 0.6 mg/dL (ref 0.3–1.2)
Total Protein: 7.4 g/dL (ref 6.0–8.3)

## 2014-11-03 LAB — LIPASE, BLOOD: Lipase: 20 U/L (ref 11–59)

## 2014-11-03 LAB — POC URINE PREG, ED: Preg Test, Ur: NEGATIVE

## 2014-11-03 MED ORDER — SINCALIDE 5 MCG IJ SOLR
INTRAMUSCULAR | Status: AC
  Administered 2014-11-03: 1.4 ug via INTRAVENOUS
  Filled 2014-11-03: qty 5

## 2014-11-03 MED ORDER — SINCALIDE 5 MCG IJ SOLR
0.0200 ug/kg | Freq: Once | INTRAMUSCULAR | Status: AC
  Administered 2014-11-03: 1.4 ug via INTRAVENOUS

## 2014-11-03 MED ORDER — TECHNETIUM TC 99M MEBROFENIN IV KIT: 5.0000 | PACK | Freq: Once | INTRAVENOUS | Status: DC | PRN

## 2014-11-03 MED ORDER — ACETAMINOPHEN 325 MG PO TABS
650.0000 mg | ORAL_TABLET | Freq: Once | ORAL | Status: AC
  Administered 2014-11-03: 650 mg via ORAL
  Filled 2014-11-03: qty 2

## 2014-11-03 MED ORDER — STERILE WATER FOR INJECTION IJ SOLN
INTRAMUSCULAR | Status: AC
  Filled 2014-11-03: qty 10

## 2014-11-03 MED ORDER — MORPHINE SULFATE 4 MG/ML IJ SOLN
4.0000 mg | Freq: Once | INTRAMUSCULAR | Status: AC
  Administered 2014-11-03: 4 mg via INTRAVENOUS
  Filled 2014-11-03: qty 1

## 2014-11-03 NOTE — ED Notes (Signed)
Pt states pain is getting worse every day, she doesn't have any appointment for surgery yet and the pain medication that was prescribed for her is not working any more, pt is constantly nauseous, but denies any vomiting or fever. Pt states pain got worse after eating 2 slice of pizza.

## 2014-11-03 NOTE — ED Provider Notes (Signed)
Patient signed out to follow-up nuclear medicine scan. Scan results reviewed no acute findings, normal function him a vitals unremarkable. Outpatient follow-up.  Elnora Morrison Mariea Clonts, MD 11/03/14 [phone]

## 2014-11-03 NOTE — ED Notes (Addendum)
Spoke to NUCLEAR MEDICINE-- pt will not go to scan until 11am-- and exam will take approx 3 hours. Will move pt to Pod C.

## 2014-11-03 NOTE — ED Notes (Signed)
The pt has been diagnosed with gallstones in the past 2 weeks.  She uis scheduled for surgery but the pain is worse today with bloating  And dizziness.  lmp due to start in 5 days

## 2014-11-03 NOTE — ED Notes (Signed)
Pharmacy tech interpreted-- explained to pt the delay for nuclear medicine -- pt states "little bit of pain" -- pt here alone,

## 2014-11-03 NOTE — Discharge Instructions (Signed)
Return to the ER if the symptoms get worse. Call the GI team and the Surgery team for your care.  Return to the Er if the symptoms get worse.  Dieta con bajo contenido de grasas para la pancreatitis o los trastornos de la vescula (Low-Fat Diet for Pancreatitis or Gallbladder Conditions) Una dieta con bajo contenido de grasas puede ser til si usted tiene pancreatitis o trastornos de la vescula. En estos trastornos, el pncreas y la vescula tienen dificultades para digerir las grasas. Un plan de alimentacin saludable con menos grasas ayudar a que el pncreas y la vescula descansen, y reducir los sntomas. QU DEBO SABER ACERCA DE ESTA [place]?  Consuma una dieta con bajo contenido de [place].  Reduzca el consumo de grasas a menos del 20% al 30% del total de caloras diarias. Esto representa menos de 50a 60gramos de grasas por da.  Recuerde que la dieta debe incluir algo de [place]. Consulte al nutricionista cul debe ser su meta diaria.  Elija alimentos saludables sin grasas o con bajo contenido de grasas. Busque las palabras "sin grasa", "bajo en grasas" o "bajo contenido de grasas".  [employer], mire la etiqueta y elija alimentos con menos de 3gramos de grasa por porcin. Coma solo una porcin.  Evite el alcohol.  No fumar. Si necesita ayuda para dejar de fumar, hable con el mdico.  Haga comidas pequeas y frecuentes en lugar de 3 comidas abundantes y pesadas. QU ALIMENTOS PUEDO COMER? Cereales Incluya granos y almidones saludables, como papas, pan integral, cereales ricos en fibras y [place] integral. Elija opciones de cereales integrales siempre que sea posible. En los adultos, los cereales integrales deben representar del 45% al 65% de las caloras diarias.  Lambert Mody y verduras Coma muchas frutas y verduras. Las frutas y verduras frescas aportan fibra a la dieta. [place] y otras fuentes de protenas Coma carnes [place], como pollo y 58. Quite la grasa de la carne antes de  cocinarla. Los [place], el pescado y los frijoles son otras fuentes de protenas. En los adultos, estos alimentos deben representar entre el 10% y el 35% de las caloras diarias. [place] y otros productos lcteos con bajo contenido de [place]. Los lcteos aportan grasas y protenas, adems de calcio.  Grasas y [employer] alimentos con alto contenido de grasas, como las comidas fritas, los [place], los productos horneados y las bebidas azucaradas.  [place] condimentos y las salsas cremosas, como la [place], [place] aportar grasa adicional. Piense si necesita usarlos o no, o use pequeas cantidades u opciones con bajo contenido de grasas. QU ALIMENTOS NO ESTN RECOMENDADOS?  Los alimentos con alto contenido de [place], por ejemplo:  Alimentos horneados.  Helados.  Tostadas francesas.  Panecillos dulces.  Pizza.  Pan de queso.  Alimentos rebozados o cubiertos con [place], salsas cremosas o queso.  Comidas fritas.  Postres y bebidas azucaradas.  Alimentos que causan gases o meteorismo Document Released: 07/09/2013 [facility] Patient Information 2015 [place], [place]. This information is not intended to replace advice given to you by your health care provider. Make sure you discuss any questions you have with your health care provider. Clico biliar (Biliary Colic)  El clico biliar es un dolor continuo o irregular en la zona superior del abdomen. Generalmente se ubica debajo de la zona derecha de la caja torcica. Aparece cuando los clculos biliares interfieren con el flujo normal de la bilis que proviene de la vescula. La bilis es un lquido que interviene en la digestin de las  grasas. Se produce en el hgado y se almacena en la vescula. Al comer, La bilis pasa desde la vescula, a travs del conducto cstico y el conducto biliar comn al intestino delgado. All se mezcla con la comida parcialmente digerida. Si un clculo obstruye alguno de esos conductos, se  detiene el flujo normal de bilis. Las clulas del conducto biliar se contraen con fuerza para mover el clculo. Esto causa el dolor del clico biliar.  SNTOMAS  El paciente se queja de dolor en la zona superior del abdomen. El dolor puede ser:  En el centro de la zona superior del abdomen, justo por debajo del esternn.  En la zona superior derecha del abdomen, donde se encuentra la vescula biliar y el hgado.  Se expande hacia la espalda, hacia el omplato derecho.  Nuseas y vmitos  El dolor comienza generalmente despus de comer.  El clico biliar aparece como una demanda de bilis por parte del sistema digestivo. La demanda de bilis es mayor luego de ingerir alimentos ricos en grasas. Los sntomas tambin Geophysicist/field seismologist que han estado ayunando e ingieren abruptamente una comida abundante. La mayora de los episodios de clico biliar mejoran luego de 1 a 5 horas. Despus que se alivia el dolor ms intenso, podr seguir sintiendo un dolor moderado en el abdomen durante un lapso de 24 horas. DIAGNSTICO Luego de escuchar la descripcin de los sntomas, el mdico realizar un examen fsico. Deber prestar atencin a la zona superior del abdomen. Esta es la zona en la que se encuentra el hgado y la vescula biliar. El mdico podr observar los clculos a travs de una ecografa. Tambin le realizaran un escaneo especializado de la vescula biliar. Le indicarn anlisis de [place], especialmente si tiene fiebre o el dolor persiste. PREVENCIN El clico biliar puede evitarse controlando los factores de riesgo que favorecen los clculos. Algunos de [employer] factores de riesgo como la herencia, el aumento de la edad y Water quality scientist son aspectos normales de la vida. La obesidad y [place] dieta rica en grasas son factores de riesgo que usted puede modificar a travs de cambios hacia un estilo de vida saludable. Las mujeres que atraviesan la menopausia y que reciben terapia de reemplazo hormonal  (estrgenos) tambin tienen ms riesgo de Actor clicos biliares. TRATAMIENTO  Le prescribirn analgsicos.  Le indicarn una dieta sin grasas.  Si el primer episodio es intenso, o si los clicos aparecen nuevamente, generalmente se indica la ciruga para extirpar la vescula (colecistectoma). Este procedimiento puede realizarse a travs de pequeas incisiones utilizando un instrumento denominado laparoscopio. Muchas veces se requiere una breve estada en el hospital. Algunas personas reciben el alta el mismo da. Es el tratamiento ms ampliamente utilizado en personas que sufren dolor por clculos biliares. Es efectivo y seguro, no tiene complicaciones en ms del 90% de los [place].  Si la ciruga no puede llevarse a cabo, podrn utilizarse medicamentos para [employer] clculos. Esta medicacin es cara y puede demorar meses o aos hasta que [place]. Slo podr disolver clculos pequeos.  En algunos casos raros, se combinan estos medicamentos con un procedimiento denominado litotricia por ondas de choque. Este procedimiento [place] ondas de choque cuidadosamente dirigidas a romper los clculos. En muchas personas tratadas con este procedimiento, los clculos vuelven a formarse luego de [employer]. PRONSTICO Si los clculos obstruyen el conducto cstico o conducto biliar comn, usted tiene el riesgo de sufrir episodios repetidos de clicos biliares. Tambin existe un 25% de probabilidades de Actor  una infeccin de la vescula biliar (colecistitisaguda) o alguna otra complicacin en los siguientes 10 a 20 aos. Si ha sido sometido a [place], progrmela para el momento en que sea conveniente para usted, y para cuando no se encuentre enfermo. INSTRUCCIONES PARA EL CUIDADO DOMICILIARIO  Beba gran cantidad de lquidos claros.  Evite las comidas con mucha grasa o fritas, o cualquier alimento que empeore su dolor.  Tome los medicamentos como se le indic. SOLICITE ATENCIN  MDICA SI:  Le sube la temperatura a ms de 100.5 F (38.1 C).  El dolor empeora con el [place].  Siente nuseas y [employer] comer y beber.  Tiene vmitos. SOLICITE ATENCIN MDICA DE INMEDIATO SI:  Siente dolor continuo e intenso en el abdomen, que no se alivia con medicamentos.  Siente nuseas y vmitos que no mejoran con medicamentos.  Tiene sntomas de clico biliar y comienza a Irene Shipper y escalofros. Esto puede ser un indicio de que ha desarrollado colecistitis. Comunquese con su mdico inmediatamente.  Su piel o la parte blanca del ojo se vuelven amarillas (ictericia). Document Released: 10/11/2007 Document Revised: 09/26/2011 [facility] Patient Information 2015 [place]. This information is not intended to replace advice given to you by your health care provider. Make sure you discuss any questions you have with your health care provider.

## 2014-11-03 NOTE — ED Provider Notes (Signed)
CSN: [id number]     Arrival date & time 11/03/14  0046 History   First MD Initiated Contact with Patient 11/03/14 0102     This chart was scribed for Varney Biles, MD by Wanda Powers, ED Scribe. This patient was seen in room B15C/B15C and the patient's care was started 1:31 AM.   Chief Complaint  Patient presents with  . Abdominal Pain   The history is provided by the patient. No language interpreter was used.    HPI Comments: Wanda Powers is a 39 y.o. female who presents to the Emergency Department complaining of constant, ongoing, gradually worsening abdominal pain x 2 days. She also reports associated nausea but no vomiting. Pt has been evaluated previously and diagnosed with gallstones 2 weeks ago. She is due to schedule an appointment for surgery but is awaiting a date. [name] admits to eating 2 slices of pizza this evening which she states worsened her pain prior to arrival. Last normal bowel movement this evening prior to arrival. No recent fever or chills. No known allergies to medications.   ROS 10 Systems reviewed and are negative for acute change except as noted in the HPI.     Past Medical History  Diagnosis Date  . Pyelonephritis 1996  . Gestational diabetes    Past Surgical History  Procedure Laterality Date  . Cesarean section     Family History  Problem Relation Age of Onset  . Hyperlipidemia Mother   . Hypertension Mother   . Cancer Mother   . Heart attack Neg Hx   . Diabetes Neg Hx   . Sudden death Neg Hx    History  Substance Use Topics  . Smoking status: Never Smoker   . Smokeless tobacco: Not on file  . Alcohol Use: No   OB History    Gravida Para Term Preterm AB TAB SAB Ectopic Multiple Living   4 3 3       3      Review of Systems  Constitutional: Negative for fever and chills.  Respiratory: Negative for cough and shortness of breath.   Gastrointestinal: Positive for nausea and abdominal pain. Negative for vomiting, diarrhea and  constipation.  Skin: Negative for rash.  Neurological: Negative for dizziness.  Psychiatric/Behavioral: Negative for confusion.      Allergies  Review of patient's allergies indicates no known allergies.  Home Medications   Prior to Admission medications   Medication Sig Start Date End Date Taking? Authorizing Provider  Multiple Vitamin (MULTIVITAMIN WITH MINERALS) TABS tablet Take 1 tablet by mouth daily.   Yes Historical Provider, MD  ondansetron (ZOFRAN) 4 MG tablet Take 1 tablet (4 mg total) by mouth every 6 (six) hours as needed for nausea or vomiting. 1/69/67  Yes Delora Fuel, MD  oxyCODONE-acetaminophen (PERCOCET) 5-325 MG per tablet Take 1 tablet by mouth every 4 (four) hours as needed for moderate pain. 8/93/81  Yes Delora Fuel, MD  folic acid (FOLVITE) 017 MCG tablet Take 400 mcg by mouth daily.    Historical Provider, MD  nortriptyline (PAMELOR) 25 MG capsule Take 1 capsule (25 mg total) by mouth at bedtime. Patient not taking: Reported on 10/13/2014 09/09/14   Willeen Niece, MD  prenatal vitamin w/FE, FA (PRENATAL 1 + 1) 27-1 MG TABS Take 1 tablet by mouth daily.    Historical Provider, MD   Triage Vitals: BP 99/61 mmHg  Pulse 60  Temp(Src) 98.4 F (36.9 C)  Resp 16  Wt 152 lb (68.947 kg)  SpO2 98%  LMP 09/26/2014   Physical Exam  Constitutional: She is oriented to person, place, and time. She appears well-developed and well-nourished. No distress.  HENT:  Head: Normocephalic and atraumatic.  Eyes: EOM are normal.  Sclera clear No jaundice   Neck: Normal range of motion.  Cardiovascular: Normal rate, regular rhythm and normal heart sounds.   Pulmonary/Chest: Effort normal and breath sounds normal.  Abdominal: Soft. She exhibits no distension. There is tenderness.  Positive bowel sounds RUQ and epigastric tenderness No peritoneal signs  Musculoskeletal: Normal range of motion.  Neurological: She is alert and oriented to person, place, and time.  Skin: Skin is  warm and dry.  Psychiatric: She has a normal mood and affect. Judgment normal.  Nursing note and vitals reviewed.   ED Course  Procedures (including critical care time)  DIAGNOSTIC STUDIES: Oxygen Saturation is 100% on RA, Normal by my interpretation.    COORDINATION OF CARE: 6:01 AM-Discussed treatment plan with pt at bedside and pt agreed to plan.     Labs Review Labs Reviewed  COMPREHENSIVE METABOLIC PANEL - Abnormal; Notable for the following:    Glucose, Bld 107 (*)    All other components within normal limits  CBC WITH DIFFERENTIAL/PLATELET  LIPASE, BLOOD  URINALYSIS, ROUTINE W REFLEX MICROSCOPIC  POC URINE PREG, ED    Imaging Review US Abdomen Limited  11/03/2014   CLINICAL DATA:  Right upper quadrant abdominal pain with nausea and bloating for 1 day.  EXAM: US ABDOMEN LIMITED - RIGHT UPPER QUADRANT  COMPARISON:  10/13/2014  FINDINGS: Gallbladder:  No gallstones or wall thickening visualized. No sonographic Murphy sign noted. A few tiny areas of comet tail artifact associated with the gallbladder wall are consistent with cholesterolosis as previously described.  Common bile duct:  Diameter: 8 mm (previously 11 mm)  Liver:  No focal lesion identified. Within normal limits in parenchymal echogenicity.  IMPRESSION: 1. Unchanged appearance of the gallbladder with evidence of adenomyomatosis. 2. Mild common bile duct dilatation, slightly decreased from prior.   Electronically Signed   By: Logan Bores   On: 11/03/2014 02:45     EKG Interpretation None      MDM   Final diagnoses:  Common bile duct dilatation  RUQ pain    I personally performed the services described in this documentation, which was scribed in my presence. The recorded information has been reviewed and is accurate.  Pt comes in with RUQ abd pain. US abdomen ordered - she had an US done last month. [place] today is unchanged. No gall stones. She has a Surgery f/u - but if the [place] is neg, and HIDA is neg, she  might need a GI f/u and not Surgery, and moreover, none of her expected outside evaluation has been done given she has insurance issues.  Will get a HIDA scan. Pain is is control now - so post HIDA patient can go home. If HIDA scan is neg, we will give her GI f/u info as well.    Varney Biles, MD 11/03/14 [phone]

## 2014-11-03 NOTE — ED Notes (Signed)
Patient off the unit for test.

## 2014-11-03 NOTE — ED Notes (Signed)
Patient transported to Ultrasound 

## 2014-11-03 NOTE — ED Notes (Signed)
Pt transported to [facility] for HIDA scan.

## 2014-11-03 NOTE — ED Notes (Signed)
Report to [place], RN to move to [employer]

## 2014-11-04 ENCOUNTER — Ambulatory Visit: Payer: No Typology Code available for payment source

## 2014-11-05 ENCOUNTER — Telehealth: Payer: Self-pay | Admitting: Family Medicine

## 2014-11-05 NOTE — Telephone Encounter (Signed)
Pt called to check on the status of her referral to [facility]. She called [facility] and they said that they have not received anything from [place]. Can we re-fax this information? jw

## 2014-11-05 NOTE — Telephone Encounter (Signed)
Re-faxed the referral to [place] at [facility].

## 2014-11-24 ENCOUNTER — Other Ambulatory Visit: Payer: Self-pay | Admitting: Surgery

## 2014-11-24 NOTE — H&P (Signed)
Wanda Powers 11/24/2014 9:48 AM Location: [facility] Patient #: [id number] DOB: 1975-07-22 Married / Language: Cleophus Powers / Race: Undefined Female History of Present Illness [name]; 11/24/2014 10:30 AM) Patient words: EVal GB.  The patient is a 39 year old female who presents with abdominal pain. Patient sent by Dr. Christa See for concern of adenomyomatosis of gallbladder and abdominal pain suspicious for biliary colic. [name]. Hispanic. She comes today by herself. She is bilingual and does not feel that she needs an interpreter today. 6 months ago with upper abdominal pain on the RIGHT side. Felt nauseated. Felt bloating. Radiated to her back. Woke her up in the middle night. Ibuprofen did not help. Not consistent with heartburn or reflux. Not related to physical activity. She's had repeated attacks. 2 in the last month for severe enough to go to the emergency room. She an option which showed some common bile duct dilation as well as some adenomyomatosis and cholesterolosis of the gallbladder. She had another severe attack after having some pizza slices. Can emergency room last month., Bile duct were normal. Surgical consultation recommended. She denies any heartburn or reflux. No history of Crohn's. No ulcerative colitis. Normally has a bowel movement every day. 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 Can walk several miles without difficulty. No history of heart or lung problems. No exertional pain. No coffee-ground emesis. No history of ulcers. She had a C-section but no other abdominal surgeries. The attacks persist. CLINICAL DATA: Right upper quadrant abdominal pain with nausea and bloating for 1 day. EXAM: US ABDOMEN LIMITED - RIGHT UPPER QUADRANT COMPARISON: 10/13/2014 FINDINGS: Gallbladder: No gallstones or wall thickening visualized. No sonographic Murphy sign noted. A few tiny areas of comet  tail artifact associated with the gallbladder wall are consistent with cholesterolosis as previously described. Common bile duct: Diameter: 8 mm (previously 11 mm) Liver: No focal lesion identified. Within normal limits in parenchymal echogenicity. IMPRESSION: 1. Unchanged appearance of the gallbladder with evidence of adenomyomatosis. 2. Mild common bile duct dilatation, slightly decreased from prior. Electronically Signed By: Logan Bores On: 11/03/2014 02:45  Vitals Height Weight BMI (Calculated) 5\' 4"  (1.626 m) 68.947 kg (152 lb) 26.2 Interpretation Summary CLINICAL DATA: Right upper quadrant abdominal pain with nausea and bloating for 1 day. EXAM: US ABDOMEN LIMITED - RIGHT UPPER QUADRANT COMPARISON: 10/13/2014 FINDINGS: Gallbladder: No gallstones or wall thickening visualized. No sonographic Murphy sign noted. A few tiny areas of comet tail artifact associated with the gallbladder wall are consistent with cholesterolosis as previously described. Common bile duct: Diameter: 8 mm (previously 11 mm) Liver: No focal lesion identified. Within normal limits in parenchymal echogenicity. IMPRESSION: 1. Unchanged appearance of the gallbladder with evidence of adenomyomatosis. 2. Mild common bile duct dilatation, slightly decreased from prior. Electronically Signed By: Logan Bores On: 11/03/2014 02:45  Other Problems Wanda Powers, CMA; 11/24/2014 9:48 AM) Hemorrhoids  Past Surgical History Illene Powers, CMA; 11/24/2014 9:48 AM) Cesarean Section - 1  Diagnostic Studies History Wanda Powers, CMA; 11/24/2014 9:48 AM) Colonoscopy never Pap Smear 1-5 years ago  Allergies Wanda Powers, CMA; 11/24/2014 9:50 AM) No Known Drug Allergies 11/24/2014  Medication History (Wanda Powers, CMA; 11/24/2014 9:51 AM) Multivitamins (Oral) Active. Medications Reconciled  Social History Illene Powers, CMA; 11/24/2014 9:48 AM) Caffeine use Tea. No alcohol  use No drug use Tobacco use Never smoker.  Family History Illene Powers, [place]; 11/24/2014 9:48 AM) Arthritis Mother. Depression Mother. Hypertension Mother. Migraine Headache Sister.  Pregnancy / Birth History Illene Powers, CMA; 11/24/2014 9:48 AM) Gravida 4 Maternal age 36-25 Para 4     Review of Systems Wanda Powers CMA; 11/24/2014 9:48 AM) General Present- Weight Gain. Not Present- Appetite Loss, Chills, Fatigue, Fever, Night Sweats and Weight Loss. Skin Present- Rash. Not Present- Change in Wart/Mole, Dryness, Hives, Jaundice, New Lesions, Non-Healing Wounds and Ulcer. HEENT Present- Earache, Seasonal Allergies and Sore Throat. Not Present- Hearing Loss, Hoarseness, Nose Bleed, Oral Ulcers, Ringing in the Ears, Sinus Pain, Visual Disturbances, Wears glasses/contact lenses and Yellow Eyes. Respiratory Not Present- Bloody sputum, Chronic Cough, Difficulty Breathing, Snoring and Wheezing. Breast Not Present- Breast Mass, Breast Pain, Nipple Discharge and Skin Changes. Cardiovascular Not Present- Chest Pain, Difficulty Breathing Lying Down, Leg Cramps, Palpitations, Rapid Heart Rate, Shortness of Breath and Swelling of Extremities. Gastrointestinal Present- Abdominal Pain, Gets full quickly at meals and Hemorrhoids. Not Present- Bloating, Bloody Stool, Change in Bowel Habits, Chronic diarrhea, Constipation, Difficulty Swallowing, Excessive gas, Indigestion, Nausea, Rectal Pain and Vomiting. Female Genitourinary Present- Frequency, Nocturia and Pelvic Pain. Not Present- Painful Urination and Urgency. Musculoskeletal Present- Back Pain and Muscle Weakness. Not Present- Joint Pain, Joint Stiffness, Muscle Pain and Swelling of Extremities. Neurological Present- Headaches. Not Present- Decreased Memory, Fainting, Numbness, Seizures, Tingling, Tremor, Trouble walking and Weakness. Psychiatric Present- Change in Sleep Pattern and Depression. Not Present- Anxiety, Bipolar, Fearful  and Frequent crying.  Vitals (Wanda Powers CMA; 11/24/2014 9:50 AM) 11/24/2014 9:48 AM Weight: 152 lb Height: 64in Body Surface Area: 1.76 m Body Mass Index: 26.09 kg/m Temp.: 97.63F(Oral)  Pulse: 59 (Regular)  BP: 102/70 (Sitting, Left Arm, Standard)     Physical Exam [name]; 11/24/2014 10:26 AM)  General Mental Status-Alert. General Appearance-Not in acute distress, Not Sickly. Orientation-Oriented X3. Hydration-Well hydrated. Voice-Normal.  Integumentary Global Assessment Upon inspection and palpation of skin surfaces of the - Axillae: non-tender, no inflammation or ulceration, no drainage. and Distribution of scalp and body hair is normal. General Characteristics Temperature - normal warmth is noted.  Head and Neck Head-normocephalic, atraumatic with no lesions or palpable masses. Face Global Assessment - atraumatic, no absence of expression. Neck Global Assessment - no abnormal movements, no bruit auscultated on the right, no bruit auscultated on the left, no decreased range of motion, non-tender. Trachea-midline. Thyroid Gland Characteristics - non-tender.  Eye Eyeball - Left-Extraocular movements intact, No Nystagmus. Eyeball - Right-Extraocular movements intact, No Nystagmus. Cornea - Left-No Hazy. Cornea - Right-No Hazy. Sclera/Conjunctiva - Left-No scleral icterus, No Discharge. Sclera/Conjunctiva - Right-No scleral icterus, No Discharge. Pupil - Left-Direct reaction to light normal. Pupil - Right-Direct reaction to light normal.  ENMT Ears Pinna - Left - no drainage observed, no generalized tenderness observed. Right - no drainage observed, no generalized tenderness observed. Nose and Sinuses External Inspection of the Nose - no destructive lesion observed. Inspection of the nares - Left - quiet respiration. Right - quiet respiration. Mouth and Throat Lips - Upper Lip - no fissures observed, no  pallor noted. Lower Lip - no fissures observed, no pallor noted. Nasopharynx - no discharge present. Oral Cavity/Oropharynx - Tongue - no dryness observed. Oral Mucosa - no cyanosis observed. Hypopharynx - no evidence of airway distress observed.  Chest and Lung Exam Inspection Movements - Normal and Symmetrical. Accessory muscles - No use of accessory muscles in breathing. Palpation Palpation of the chest reveals - Non-tender. Auscultation Breath sounds - Normal and Clear.  Cardiovascular Auscultation Rhythm - Regular. Murmurs & Other Heart Sounds - Auscultation of  the heart reveals - No Murmurs and No Systolic Clicks.  Abdomen Inspection Inspection of the abdomen reveals - No Visible peristalsis and No Abnormal pulsations. Umbilicus - No Bleeding, No Urine drainage. Palpation/Percussion Palpation and Percussion of the abdomen reveal - Soft, Non Tender, No Rebound tenderness, No Rigidity (guarding) and No Cutaneous hyperesthesia. Note: Mild epigastric and RIGHT upper quadrant discomfort. No true Murphy sign. No diastases. No umbilical hernia.   Female Genitourinary Sexual Maturity Tanner 5 - Adult hair pattern. Note: No vaginal bleeding nor discharge. No inguinal hernias. No lower abdominal pain   Peripheral Vascular Upper Extremity Inspection - Left - No Cyanotic nailbeds, Not Ischemic. Right - No Cyanotic nailbeds, Not Ischemic.  Neurologic Neurologic evaluation reveals -normal attention span and ability to concentrate, able to name objects and repeat phrases. Appropriate fund of knowledge , normal sensation and normal coordination. Mental Status Affect - not angry, not paranoid. Cranial Nerves-Normal Bilaterally. Gait-Normal.  Neuropsychiatric Mental status exam performed with findings of-able to articulate well with normal speech/language, rate, volume and coherence, thought content normal with ability to perform basic computations and apply abstract reasoning  and no evidence of hallucinations, delusions, obsessions or homicidal/suicidal ideation.  Musculoskeletal Global Assessment Spine, Ribs and Pelvis - no instability, subluxation or laxity. Right Upper Extremity - no instability, subluxation or laxity.  Lymphatic Head & Neck  General Head & Neck Lymphatics: Bilateral - Description - No Localized lymphadenopathy. Axillary  General Axillary Region: Bilateral - Description - No Localized lymphadenopathy. Femoral & Inguinal  Generalized Femoral & Inguinal Lymphatics: Left - Description - No Localized lymphadenopathy. Right - Description - No Localized lymphadenopathy.    Assessment & Plan [name]; 11/24/2014 10:25 AM)  CHRONIC CHOLECYSTITIS (575.11  K81.1) Impression: Classic episodes of biliary colic with adenomyomatosis & cholesterolosis of gallbladder. I believe she would benefit from cholecystectomy. Rest of differential diagnosis seems unlikely.  Current Plans Schedule for Surgery Written instructions provided Discussed regular exercise with patient. The anatomy & physiology of hepatobiliary & pancreatic function was discussed. The pathophysiology of gallbladder dysfunction was discussed. Natural history risks without surgery was discussed. I feel the risks of no intervention will lead to serious problems that outweigh the operative risks; therefore, I recommended cholecystectomy to remove the pathology. I explained laparoscopic techniques with possible need for an open approach. Probable cholangiogram to evaluate the bilary tract was explained as well.  Risks such as bleeding, infection, abscess, leak, injury to other organs, need for further treatment, heart attack, death, and other risks were discussed. I noted a good likelihood this will help address the problem. Possibility that this will not correct all abdominal symptoms was explained. Goals of post-operative recovery were discussed as well. We will work to minimize  complications. An educational handout further explaining the pathology and treatment options was given as well. Questions were answered. The patient expresses understanding & wishes to proceed with surgery. Pt Education - [facility] Laparosopic Post Op HCI (Jaikob Borgwardt) Pt Education - [facility] Pain Control (Jaynell Castagnola) Pt Education - [facility] Good Bowel Health (Sabre Leonetti) Pt Education - Cholecystitis: gall bladder  Adin Hector, M.D., F.A.C.S. Gastrointestinal and Minimally Invasive Surgery [facility] [address] [place], [place] [address] [phone] Main / Paging

## 2014-11-25 NOTE — H&P (Signed)
L&D Evaluation:  History:  HPI 39 yo [id number], estimated date of confinement 12/22/12, admitted in labor   Patient's Medical History AMA; GDM   Patient's Surgical History Previous C-Section   Medications Pre Natal Vitamins   Allergies NKDA   Social History none   Family History Non-Contributory   ROS:  ROS All systems were reviewed.  HEENT, CNS, GI, GU, Respiratory, CV, Renal and Musculoskeletal systems were found to be normal.   Exam:  Vital Signs stable   General Uncomfortable with contractions   Mental Status clear   Heart normal sinus rhythm   Abdomen gravid, tender with contractions   Estimated Fetal Weight Average for gestational age, 9#   Back no CVAT   Edema no edema   Pelvic no external lesions, 8-9/90/-1/vtx/arom - blood tinged   Description bloody   FHT normal rate with no decels   Ucx regular   Skin dry   Lymph no lymphadenopathy   Impression:  Impression active labor, VBAC   Plan:  Plan TOLAC protocol   Electronic Signatures: Diannah Rindfleisch, Alanda Slim (MD)  (Signed 06-Jun-14 08:43)  Authored: L&D Evaluation   Last Updated: 06-Jun-14 08:43 by Berniece Abid, Alanda Slim (MD)

## 2014-11-25 NOTE — H&P (Signed)
L&D Evaluation:  History:  HPI 39 Year old married Hispanic female [id number], at 39.6 weeks admitted with contractions. Prenatal course notable for prior C/S and Fetal macrosomia. Pt has GDM, diet controlled; EFW 3700 grams on last U/S; total wt gain for pregnancy 38. Marland Kitchenpounds (prior pregnancy 58 pound wt gain). Prenatal care obtained in [place].Pt desires to deliver here.   Presents with contractions   Patient's Medical History GDM   Patient's Surgical History Previous C-Section  10 pound infant   Medications Pre Natal Vitamins   Allergies NKDA   Social History none   Family History Non-Contributory   ROS:  ROS All systems were reviewed.  HEENT, CNS, GI, GU, Respiratory, CV, Renal and Musculoskeletal systems were found to be normal.   Exam:  Vital Signs stable   Urine Protein negative dipstick   General no apparent distress   Mental Status clear   Heart normal sinus rhythm   Abdomen gravid, non-tender   Estimated Fetal Weight Average for gestational age, 8#4   Back no CVAT   Edema no edema   Pelvic no external lesions, 4cm/70/-2/vtx/bowi (no change in 4 hours)   Mebranes Intact   FHT normal rate with no decels   Ucx irregular   Skin dry   Lymph no lymphadenopathy   Impression:  Impression early labor, VBAC candidate   Plan:  Plan monitor contractions and for cervical change, discharge   Comments Labor precautions; Return tomorrow for reassessment and possible augmentation of labor. Pt has been counseled regarding VBAC risks and benefits. Pt is accepting of all, including possible uterine rupture risk at  1%.   Follow Up Appointment Tomorrow   Electronic Signatures: Kaleah Hagemeister, Alanda Slim (MD)  (Signed 05-Jun-14 17:35)  Authored: L&D Evaluation   Last Updated: 05-Jun-14 17:35 by Shamiah Kahler, Alanda Slim (MD)

## 2014-12-22 ENCOUNTER — Encounter (HOSPITAL_COMMUNITY)
Admission: RE | Admit: 2014-12-22 | Discharge: 2014-12-22 | Disposition: A | Discharge status: Home / Self Care | Payer: No Typology Code available for payment source | Source: Ambulatory Visit | Attending: Family Medicine | Admitting: Family Medicine

## 2014-12-22 NOTE — Patient Instructions (Signed)
Wanda Powers  12/22/2014   Your procedure is scheduled on: Thursday 12/25/14  Report to [facility] Main  Entrance and follow signs to               [place] at 11:00 AM.  Call this number if you have problems the morning of surgery [phone]   Remember: ONLY 1 PERSON MAY GO WITH YOU TO SHORT STAY TO GET  READY MORNING OF [place].  Do not eat food or drink liquids :After Midnight.   Take these medicines the morning of surgery with A SIP OF WATER:                                You may not have any metal on your body including hair pins and              piercings  Do not wear jewelry, make-up, lotions, powders or perfumes, deodorant             Do not wear nail polish.  Do not shave  48 hours prior to surgery.              Men may shave face and neck.  Do not bring valuables to the hospital. [place].  Contacts, dentures or bridgework may not be worn into surgery.     Patients discharged the day of surgery will not be allowed to drive home.  Name and phone number of your driver:   _____________________________________________________________________           [facility] - Preparing for Surgery Before surgery, you can play an important role.  Because skin is not sterile, your skin needs to be as free of germs as possible.  You can reduce the number of germs on your skin by washing with CHG (chlorahexidine gluconate) soap before surgery.  CHG is an antiseptic cleaner which kills germs and bonds with the skin to continue killing germs even after washing. Please DO NOT use if you have an allergy to CHG or antibacterial soaps.  If your skin becomes reddened/irritated stop using the CHG and inform your nurse when you arrive at Short Stay. Do not shave (including legs and underarms) for at least 48 hours prior to the first CHG shower.  You may shave your face/neck. Please follow these instructions  carefully:  1.  Shower with CHG Soap the night before surgery and the  morning of Surgery.  2.  If you choose to wash your hair, wash your hair first as usual with your  normal  shampoo.  3.  After you shampoo, rinse your hair and body thoroughly to remove the  shampoo.                            4.  Use CHG as you would any other liquid soap.  You can apply chg directly  to the skin and wash                       Gently with a scrungie or clean washcloth.  5.  Apply the CHG Soap to your body ONLY FROM THE NECK DOWN.   Do not  use on face/ open                           Wound or open sores. Avoid contact with eyes, ears mouth and genitals (private parts).                       Wash face,  Genitals (private parts) with your normal soap.             6.  Wash thoroughly, paying special attention to the area where your surgery  will be performed.  7.  Thoroughly rinse your body with warm water from the neck down.  8.  DO NOT shower/wash with your normal soap after using and rinsing off  the CHG Soap.                9.  Pat yourself dry with a clean towel.            10.  Wear clean pajamas.            11.  Place clean sheets on your bed the night of your first shower and do not  sleep with pets. Day of Surgery : Do not apply any lotions/deodorants the morning of surgery.  Please wear clean clothes to the hospital/surgery center.  FAILURE TO FOLLOW THESE INSTRUCTIONS MAY RESULT IN THE CANCELLATION OF YOUR SURGERY PATIENT SIGNATURE_________________________________  NURSE SIGNATURE__________________________________  ________________________________________________________________________

## 2014-12-23 ENCOUNTER — Encounter (HOSPITAL_COMMUNITY): Payer: Self-pay

## 2014-12-23 ENCOUNTER — Encounter (HOSPITAL_COMMUNITY)
Admission: RE | Admit: 2014-12-23 | Discharge: 2014-12-23 | Disposition: A | Discharge status: Home / Self Care | Payer: No Typology Code available for payment source | Source: Ambulatory Visit | Attending: Surgery | Admitting: Surgery

## 2014-12-23 DIAGNOSIS — K8050 Calculus of bile duct without cholangitis or cholecystitis without obstruction: Secondary | ICD-10-CM | POA: Insufficient documentation

## 2014-12-23 DIAGNOSIS — Z01818 Encounter for other preprocedural examination: Secondary | ICD-10-CM | POA: Insufficient documentation

## 2014-12-23 HISTORY — DX: Headache, unspecified: R51.9

## 2014-12-23 HISTORY — DX: Headache: R51

## 2014-12-23 LAB — CBC
HCT: 40.1 % (ref 36.0–46.0)
Hemoglobin: 13.6 g/dL (ref 12.0–15.0)
MCH: 29.4 pg (ref 26.0–34.0)
MCHC: 33.9 g/dL (ref 30.0–36.0)
MCV: 86.6 fL (ref 78.0–100.0)
Platelets: 215 10*3/uL (ref 150–400)
RBC: 4.63 MIL/uL (ref 3.87–5.11)
RDW: 13 % (ref 11.5–15.5)
WBC: 4.9 10*3/uL (ref 4.0–10.5)

## 2014-12-23 LAB — HCG, SERUM, QUALITATIVE: Preg, Serum: NEGATIVE

## 2014-12-23 NOTE — Patient Instructions (Addendum)
Wanda Powers  12/23/2014   Your procedure is scheduled on: 12-25-14  Report to [facility] Main  Entrance and follow signs to               [place] at 1100 AM.  Call this number if you have problems the morning of surgery [phone]   Remember: ONLY 1 PERSON MAY GO WITH YOU TO SHORT STAY TO GET  READY MORNING OF [place].  Do not eat food :After Midnight.clear liquids midnight until 700 am on 12-25-14, nothing by mouth after 700 am.      Take these medicines the morning of surgery with A SIP OF WATER: none                               You may not have any metal on your body including hair pins and              piercings  Do not wear jewelry, make-up, lotions, powders or perfumes, deodorant             Do not wear nail polish.  Do not shave  48 hours prior to surgery.              Men may shave face and neck.   Do not bring valuables to the hospital. [place].  Contacts, dentures or bridgework may not be worn into surgery.  Leave suitcase in the car. After surgery it may be brought to your room.     Patients discharged the day of surgery will not be allowed to drive home.  Name and phone number of your driver:louis estrada spouse cell [phone]  Special Instructions: N/A              Please read over the following fact sheets you were given: _____________________________________________________________________             [facility] - Preparing for Surgery Before surgery, you can play an important role.  Because skin is not sterile, your skin needs to be as free of germs as possible.  You can reduce the number of germs on your skin by washing with CHG (chlorahexidine gluconate) soap before surgery.  CHG is an antiseptic cleaner which kills germs and bonds with the skin to continue killing germs even after washing. Please DO NOT use if you have an allergy to CHG or antibacterial soaps.  If  your skin becomes reddened/irritated stop using the CHG and inform your nurse when you arrive at Short Stay. Do not shave (including legs and underarms) for at least 48 hours prior to the first CHG shower.  You may shave your face/neck. Please follow these instructions carefully:  1.  Shower with CHG Soap the night before surgery and the  morning of Surgery.  2.  If you choose to wash your hair, wash your hair first as usual with your  normal  shampoo.  3.  After you shampoo, rinse your hair and body thoroughly to remove the  shampoo.                           4.  Use CHG as you would any other liquid soap.  You  can apply chg directly  to the skin and wash                       Gently with a scrungie or clean washcloth.  5.  Apply the CHG Soap to your body ONLY FROM THE NECK DOWN.   Do not use on face/ open                           Wound or open sores. Avoid contact with eyes, ears mouth and genitals (private parts).                       Wash face,  Genitals (private parts) with your normal soap.             6.  Wash thoroughly, paying special attention to the area where your surgery  will be performed.  7.  Thoroughly rinse your body with warm water from the neck down.  8.  DO NOT shower/wash with your normal soap after using and rinsing off  the CHG Soap.                9.  Pat yourself dry with a clean towel.            10.  Wear clean pajamas.            11.  Place clean sheets on your bed the night of your first shower and do not  sleep with pets. Day of Surgery : Do not apply any lotions/deodorants the morning of surgery.  Please wear clean clothes to the hospital/surgery center.  FAILURE TO FOLLOW THESE INSTRUCTIONS MAY RESULT IN THE CANCELLATION OF YOUR SURGERY PATIENT SIGNATURE_________________________________  NURSE SIGNATURE__________________________________  ________________________________________________________________________    CLEAR LIQUID DIET   Foods Allowed                                                                      Foods Excluded  Coffee and tea, regular and decaf                             liquids that you cannot  Plain Jell-O in any flavor                                             see through such as: Fruit ices (not with fruit pulp)                                     milk, soups, orange juice  Iced Popsicles                                    All solid food Carbonated beverages, regular and diet  Cranberry, grape and apple juices Sports drinks like Gatorade Lightly seasoned clear broth or consume(fat free) Sugar, honey syrup  Sample Menu Breakfast                                Lunch                                     Supper Cranberry juice                    Beef broth                            Chicken broth Jell-O                                     Grape juice                           Apple juice Coffee or tea                        Jell-O                                      Popsicle                                                Coffee or tea                        Coffee or tea  _____________________________________________________________________

## 2014-12-25 ENCOUNTER — Ambulatory Visit (HOSPITAL_COMMUNITY): Payer: Medicaid Other | Admitting: Certified Registered Nurse Anesthetist

## 2014-12-25 ENCOUNTER — Encounter (HOSPITAL_COMMUNITY): Payer: Self-pay | Admitting: Certified Registered Nurse Anesthetist

## 2014-12-25 ENCOUNTER — Encounter (HOSPITAL_COMMUNITY)
Admission: RE | Disposition: A | Discharge status: Home / Self Care | Payer: Self-pay | Source: Ambulatory Visit | Attending: Surgery

## 2014-12-25 ENCOUNTER — Ambulatory Visit (HOSPITAL_COMMUNITY)
Admission: RE | Admit: 2014-12-25 | Discharge: 2014-12-25 | Disposition: A | Discharge status: Home / Self Care | Payer: Medicaid Other | Source: Ambulatory Visit | Attending: Surgery | Admitting: Surgery

## 2014-12-25 ENCOUNTER — Ambulatory Visit (HOSPITAL_COMMUNITY): Payer: Medicaid Other

## 2014-12-25 DIAGNOSIS — D135 Benign neoplasm of extrahepatic bile ducts: Secondary | ICD-10-CM | POA: Diagnosis present

## 2014-12-25 DIAGNOSIS — K649 Unspecified hemorrhoids: Secondary | ICD-10-CM | POA: Insufficient documentation

## 2014-12-25 DIAGNOSIS — K811 Chronic cholecystitis: Secondary | ICD-10-CM

## 2014-12-25 DIAGNOSIS — K819 Cholecystitis, unspecified: Secondary | ICD-10-CM

## 2014-12-25 HISTORY — PX: LAPAROSCOPIC CHOLECYSTECTOMY SINGLE SITE WITH INTRAOPERATIVE CHOLANGIOGRAM: SHX6538

## 2014-12-25 HISTORY — DX: Chronic cholecystitis: K81.1

## 2014-12-25 SURGERY — LAPAROSCOPIC CHOLECYSTECTOMY SINGLE SITE WITH INTRAOPERATIVE CHOLANGIOGRAM
Anesthesia: General | Site: Abdomen

## 2014-12-25 MED ORDER — MEPERIDINE HCL 50 MG/ML IJ SOLN: 6.2500 mg | INTRAMUSCULAR | Status: DC | PRN

## 2014-12-25 MED ORDER — DIPHENHYDRAMINE HCL 50 MG/ML IJ SOLN
INTRAMUSCULAR | Status: AC
  Filled 2014-12-25: qty 1

## 2014-12-25 MED ORDER — ONDANSETRON HCL 4 MG/2ML IJ SOLN
INTRAMUSCULAR | Status: AC
  Filled 2014-12-25: qty 2

## 2014-12-25 MED ORDER — HYDROMORPHONE HCL 1 MG/ML IJ SOLN
INTRAMUSCULAR | Status: AC
  Filled 2014-12-25: qty 1

## 2014-12-25 MED ORDER — FENTANYL CITRATE (PF) 250 MCG/5ML IJ SOLN
INTRAMUSCULAR | Status: AC
  Filled 2014-12-25: qty 5

## 2014-12-25 MED ORDER — GLYCOPYRROLATE 0.2 MG/ML IJ SOLN
INTRAMUSCULAR | Status: AC
  Filled 2014-12-25: qty 1

## 2014-12-25 MED ORDER — BUPIVACAINE-EPINEPHRINE 0.25% -1:200000 IJ SOLN
INTRAMUSCULAR | Status: AC
  Filled 2014-12-25: qty 1

## 2014-12-25 MED ORDER — NAPROXEN 500 MG PO TABS: 500.0000 mg | ORAL_TABLET | Freq: Two times a day (BID) | ORAL | Status: DC

## 2014-12-25 MED ORDER — KETOROLAC TROMETHAMINE 30 MG/ML IJ SOLN
INTRAMUSCULAR | Status: DC | PRN
  Administered 2014-12-25: 30 mg via INTRAVENOUS

## 2014-12-25 MED ORDER — LIDOCAINE HCL (CARDIAC) 20 MG/ML IV SOLN
INTRAVENOUS | Status: AC
  Filled 2014-12-25: qty 5

## 2014-12-25 MED ORDER — BUPIVACAINE LIPOSOME 1.3 % IJ SUSP
20.0000 mL | Freq: Once | INTRAMUSCULAR | Status: AC
  Administered 2014-12-25: 20 mL
  Filled 2014-12-25: qty 20

## 2014-12-25 MED ORDER — OXYCODONE HCL 5 MG PO TABS
5.0000 mg | ORAL_TABLET | ORAL | Status: DC | PRN
  Administered 2014-12-25: 5 mg via ORAL
  Filled 2014-12-25: qty 1

## 2014-12-25 MED ORDER — MIDAZOLAM HCL 2 MG/2ML IJ SOLN
INTRAMUSCULAR | Status: AC
  Filled 2014-12-25: qty 2

## 2014-12-25 MED ORDER — ROCURONIUM BROMIDE 100 MG/10ML IV SOLN
INTRAVENOUS | Status: AC
  Filled 2014-12-25: qty 1

## 2014-12-25 MED ORDER — KETOROLAC TROMETHAMINE 30 MG/ML IJ SOLN
INTRAMUSCULAR | Status: AC
  Filled 2014-12-25: qty 1

## 2014-12-25 MED ORDER — NEOSTIGMINE METHYLSULFATE 10 MG/10ML IV SOLN
INTRAVENOUS | Status: DC | PRN
  Administered 2014-12-25: 3 mg via INTRAVENOUS

## 2014-12-25 MED ORDER — LACTATED RINGERS IV SOLN: INTRAVENOUS | Status: DC

## 2014-12-25 MED ORDER — 0.9 % SODIUM CHLORIDE (POUR BTL) OPTIME
TOPICAL | Status: DC | PRN
  Administered 2014-12-25: 1000 mL

## 2014-12-25 MED ORDER — ONDANSETRON HCL 4 MG/2ML IJ SOLN
INTRAMUSCULAR | Status: DC | PRN
  Administered 2014-12-25: 4 mg via INTRAVENOUS

## 2014-12-25 MED ORDER — LACTATED RINGERS IV SOLN
INTRAVENOUS | Status: DC
  Administered 2014-12-25: 1000 mL via INTRAVENOUS
  Administered 2014-12-25 (×2): via INTRAVENOUS

## 2014-12-25 MED ORDER — FENTANYL CITRATE (PF) 100 MCG/2ML IJ SOLN
INTRAMUSCULAR | Status: AC
  Filled 2014-12-25: qty 2

## 2014-12-25 MED ORDER — ROCURONIUM BROMIDE 100 MG/10ML IV SOLN
INTRAVENOUS | Status: DC | PRN
  Administered 2014-12-25: 30 mg via INTRAVENOUS

## 2014-12-25 MED ORDER — SUCCINYLCHOLINE CHLORIDE 20 MG/ML IJ SOLN
INTRAMUSCULAR | Status: DC | PRN
  Administered 2014-12-25: 100 mg via INTRAVENOUS

## 2014-12-25 MED ORDER — PROPOFOL 10 MG/ML IV BOLUS
INTRAVENOUS | Status: DC | PRN
  Administered 2014-12-25: 100 mg via INTRAVENOUS
  Administered 2014-12-25: 130 mg via INTRAVENOUS

## 2014-12-25 MED ORDER — LIDOCAINE HCL (CARDIAC) 20 MG/ML IV SOLN
INTRAVENOUS | Status: DC | PRN
  Administered 2014-12-25: 30 mg via INTRAVENOUS

## 2014-12-25 MED ORDER — MIDAZOLAM HCL 5 MG/5ML IJ SOLN
INTRAMUSCULAR | Status: DC | PRN
  Administered 2014-12-25: 2 mg via INTRAVENOUS

## 2014-12-25 MED ORDER — LACTATED RINGERS IR SOLN
Status: DC | PRN
  Administered 2014-12-25: 1000 mL

## 2014-12-25 MED ORDER — OXYCODONE HCL 5 MG PO TABS: 5.0000 mg | ORAL_TABLET | ORAL | Status: DC | PRN

## 2014-12-25 MED ORDER — DEXAMETHASONE SODIUM PHOSPHATE 10 MG/ML IJ SOLN
INTRAMUSCULAR | Status: AC
  Filled 2014-12-25: qty 1

## 2014-12-25 MED ORDER — HYDROMORPHONE HCL 1 MG/ML IJ SOLN
0.2500 mg | INTRAMUSCULAR | Status: DC | PRN
  Administered 2014-12-25 (×4): 0.5 mg via INTRAVENOUS

## 2014-12-25 MED ORDER — DEXAMETHASONE SODIUM PHOSPHATE 10 MG/ML IJ SOLN
INTRAMUSCULAR | Status: DC | PRN
  Administered 2014-12-25: 5 mg via INTRAVENOUS

## 2014-12-25 MED ORDER — FENTANYL CITRATE (PF) 250 MCG/5ML IJ SOLN
INTRAMUSCULAR | Status: DC | PRN
  Administered 2014-12-25: 50 ug via INTRAVENOUS
  Administered 2014-12-25: 100 ug via INTRAVENOUS
  Administered 2014-12-25 (×2): 50 ug via INTRAVENOUS

## 2014-12-25 MED ORDER — DIPHENHYDRAMINE HCL 50 MG/ML IJ SOLN
12.5000 mg | Freq: Once | INTRAMUSCULAR | Status: AC
  Administered 2014-12-25: 12.5 mg via INTRAVENOUS

## 2014-12-25 MED ORDER — PROMETHAZINE HCL 25 MG/ML IJ SOLN
6.2500 mg | INTRAMUSCULAR | Status: DC | PRN
  Administered 2014-12-25: 6.25 mg via INTRAVENOUS
  Filled 2014-12-25: qty 1

## 2014-12-25 MED ORDER — SODIUM CHLORIDE 0.9 % IJ SOLN
INTRAMUSCULAR | Status: AC
  Filled 2014-12-25: qty 50

## 2014-12-25 MED ORDER — PROPOFOL 10 MG/ML IV BOLUS
INTRAVENOUS | Status: AC
  Filled 2014-12-25: qty 20

## 2014-12-25 MED ORDER — GLYCOPYRROLATE 0.2 MG/ML IJ SOLN
INTRAMUSCULAR | Status: DC | PRN
  Administered 2014-12-25: 0.6 mg via INTRAVENOUS

## 2014-12-25 MED ORDER — NEOSTIGMINE METHYLSULFATE 10 MG/10ML IV SOLN
INTRAVENOUS | Status: AC
  Filled 2014-12-25: qty 1

## 2014-12-25 MED ORDER — FENTANYL CITRATE (PF) 100 MCG/2ML IJ SOLN
25.0000 ug | INTRAMUSCULAR | Status: DC | PRN
  Administered 2014-12-25 (×2): 50 ug via INTRAVENOUS

## 2014-12-25 SURGICAL SUPPLY — 36 items
APPLIER CLIP 5 13 M/L LIGAMAX5 (MISCELLANEOUS) ×2
CABLE HIGH FREQUENCY MONO STRZ (ELECTRODE) ×2 IMPLANT
CHLORAPREP W/TINT 26ML (MISCELLANEOUS) ×2 IMPLANT
CLIP APPLIE 5 13 M/L LIGAMAX5 (MISCELLANEOUS) ×1 IMPLANT
COVER MAYO STAND STRL (DRAPES) ×2 IMPLANT
DECANTER SPIKE VIAL GLASS SM (MISCELLANEOUS) ×2 IMPLANT
DRAIN CHANNEL 19F RND (DRAIN) IMPLANT
DRAPE C-ARM 42X120 X-RAY (DRAPES) ×2 IMPLANT
DRAPE LAPAROSCOPIC ABDOMINAL (DRAPES) ×2 IMPLANT
DRAPE UTILITY XL STRL (DRAPES) ×2 IMPLANT
DRAPE WARM FLUID 44X44 (DRAPE) ×2 IMPLANT
DRSG TEGADERM 4X4.75 (GAUZE/BANDAGES/DRESSINGS) ×2 IMPLANT
ELECT REM PT RETURN 9FT ADLT (ELECTROSURGICAL) ×2
ELECTRODE REM PT RTRN 9FT ADLT (ELECTROSURGICAL) ×1 IMPLANT
ENDOLOOP SUT PDS II  0 18 (SUTURE)
ENDOLOOP SUT PDS II 0 18 (SUTURE) IMPLANT
EVACUATOR SILICONE 100CC (DRAIN) IMPLANT
GAUZE SPONGE 2X2 8PLY STRL LF (GAUZE/BANDAGES/DRESSINGS) ×1 IMPLANT
GLOVE ECLIPSE 8.0 STRL XLNG CF (GLOVE) ×2 IMPLANT
GLOVE INDICATOR 8.0 STRL GRN (GLOVE) ×2 IMPLANT
GOWN STRL REUS W/TWL XL LVL3 (GOWN DISPOSABLE) ×4 IMPLANT
KIT BASIN OR (CUSTOM PROCEDURE TRAY) ×2 IMPLANT
NS IRRIG 1000ML POUR BTL (IV SOLUTION) ×2 IMPLANT
POUCH SPECIMEN RETRIEVAL 10MM (ENDOMECHANICALS) ×2 IMPLANT
SCISSORS LAP 5X35 DISP (ENDOMECHANICALS) ×2 IMPLANT
SET CHOLANGIOGRAPH MIX (MISCELLANEOUS) ×2 IMPLANT
SET IRRIG TUBING LAPAROSCOPIC (IRRIGATION / IRRIGATOR) ×2 IMPLANT
SHEARS HARMONIC ACE PLUS 36CM (ENDOMECHANICALS) ×2 IMPLANT
SPONGE GAUZE 2X2 STER 10/PKG (GAUZE/BANDAGES/DRESSINGS) ×1
SUT MNCRL AB 4-0 PS2 18 (SUTURE) ×2 IMPLANT
SYR 20CC LL (SYRINGE) ×2 IMPLANT
TOWEL OR 17X26 10 PK STRL BLUE (TOWEL DISPOSABLE) ×2 IMPLANT
TOWEL OR NON WOVEN STRL DISP B (DISPOSABLE) ×2 IMPLANT
TRAY LAPAROSCOPIC (CUSTOM PROCEDURE TRAY) ×2 IMPLANT
TROCAR BLADELESS OPT 5 100 (ENDOMECHANICALS) ×2 IMPLANT
TROCAR BLADELESS OPT 5 150 (ENDOMECHANICALS) ×2 IMPLANT

## 2014-12-25 NOTE — Op Note (Signed)
12/25/2014  2:37 PM  PATIENT:  [name]  39 y.o. 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  INDICATION: Patient with biliary colic & adenomyosis of gallbladder.  Rest of differential diagnosis unlikely.    The anatomy & physiology of hepatobiliary & pancreatic function was discussed.  The pathophysiology of gallbladder dysfunction was discussed.  Natural history risks without surgery was discussed.   I feel the risks of no intervention will lead to serious problems that outweigh the operative risks; therefore, I recommended cholecystectomy to remove the pathology.  I explained laparoscopic techniques with possible need for an open approach.  Probable cholangiogram to evaluate the bilary tract was explained as well.    Risks such as bleeding, infection, abscess, leak, injury to other organs, need for further treatment, heart attack, death, and other risks were discussed.  I noted a good likelihood this will help address the problem.  Possibility that this will not correct all abdominal symptoms was explained.  Goals of post-operative recovery  were discussed as well.  We will work to minimize complications.  An educational handout further explaining the pathology and treatment options was given as well.  Questions were answered.  The patient expresses understanding & wishes to proceed with surgery.  OR FINDINGS: Thickened gallbladder.  Strictured infundibulum / proximal cystic duct.  Dilated biliary system but no CBD obstruction  DESCRIPTION:   The patient was identified & brought in the operating room. The patient was positioned supine with arms tucked. SCDs were active during the entire case. The patient underwent general anesthesia without any difficulty.  The abdomen was prepped and draped in a sterile fashion. A Surgical Timeout confirmed our plan.  I made a transverse curvilinear incision through the superior umbilical fold.  I placed a 71mm long port through the supraumbilical fascia using a modified Hassan cutdown technique. I began carbon dioxide insufflation. Camera inspection revealed no injury. There were no adhesions to the anterior abdominal wall supraumbilically.  I proceeded to continue with single site technique. I placed a #5 port in left upper aspect of the wound. I placed a 5 mm atraumatic grasper in the right inferior aspect of the wound.  I turned attention to the right upper quadrant.  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  These were transected.  This allowed the liver to be better mobilize.  Patient also had some infraumbilical omental adhesions.  These were transected with Harmonic scalpel as well.  No other adhesions noted.  The gallbladder fundus was elevated cephalad. I freed the peritoneal coverings between the gallbladder and the liver on the posteriolateral and anteriomedial walls. I alternated  between Harmonic & blunt Maryland dissection to help get a good critical view of the cystic artery and cystic duct. I did further dissection to free a few  centimeters of the  gallbladder off the liver bed to get a good critical view of the infundibulum and cystic duct. I mobilized the cystic artery; and, after getting a good 360 view, ligated the cystic artery using the Harmonic ultrasonic dissection. I skeletonized the cystic duct.  I placed a clip on the infundibulum. I did a partial cystic duct-otomy and ensured patency.  I placed a 5 [place] cholangiocatheter through a puncture site at the right subcostal ridge of the abdominal wall and directed it into the cystic duct.  It did not easily pass across the strictured infundibulum/proximal cystic duct.  I created a new cystic ductotomy and the mid cystic duct in which the catheter advanced more easily  We ran a cholangiogram with dilute radio-opaque contrast and continuous fluoroscopy. Contrast flowed from a side branch consistent with cystic duct cannulization. Contrast flowed up the common hepatic duct into the right and left intrahepatic chains out to secondary radicals. Contrast flowed down the common bile duct easily across the normal ampulla into the duodenum.  The common bowel duct was somewhat dilated but there is no stricture or abnormality or evidence of common bile duct stones.  This was consistent with a normal cholangiogram.  I removed the cholangiocatheter. I placed clips on the cystic duct x4.  I completed cystic duct transection. I freed the gallbladder from its remaining attachments to the liver. I ensured hemostasis on the gallbladder fossa of the liver and elsewhere. I inspected the rest of the abdomen & detected no injury nor bleeding elsewhere.  I removed the gallbladder out the supraumbilical fascia. I closed the fascia transversely using 0 Vicryl interrupted stitches. A closed the skin using 4-0 monocryl stitch.  Sterile dressing was applied. The patient was extubated & arrived in the PACU in stable condition..  I had discussed postoperative care with the patient in the holding area.   There is no family in the hospital but I did call her husband & left a message per her request.  I am about to locate the patient's family and discuss operative findings and postoperative goals / instructions.  Instructions are written in the chart as well.  Adin Hector, M.D., F.A.C.S. Gastrointestinal and Minimally Invasive Surgery [facility] [address], [place], [place] [address] [phone] Main / Paging

## 2014-12-25 NOTE — Transfer of Care (Signed)
Immediate Anesthesia Transfer of Care Note  Patient: Wanda Powers  Procedure(s) Performed: Procedure(s): LAPAROSCOPIC CHOLECYSTECTOMY SINGLE SITE WITH INTRAOPERATIVE CHOLANGIOGRAM (N/A)  Patient Location: PACU  Anesthesia Type:General  Level of Consciousness: awake, alert  and oriented  Airway & Oxygen Therapy: Patient Spontanous Breathing and Patient connected to face mask oxygen  Post-op Assessment: Report given to RN and Post -op Vital signs reviewed and stable  Post vital signs: Reviewed and stable  Last Vitals:  Filed Vitals:   12/25/14 1126  BP: 116/75  Pulse: 63  Temp: 36.9 C  Resp: 18    Complications: No apparent anesthesia complications

## 2014-12-25 NOTE — H&P (Signed)
Wanda Powers 11/24/2014 9:48 AM Location: [facility] Patient #: [id number] DOB: [birth year] Married / Language: English / Race: Undefined Female  History of Present Illness  Patient words: EVal GB.  Patient Care Team: Willeen Niece, MD as PCP - General (Family Medicine)   The patient is a 39 year old female who presents with abdominal pain. Patient sent by Dr. Christa See for concern of adenomyomatosis of gallbladder and abdominal pain suspicious for biliary colic. Pleasant young female. Hispanic. She comes today by herself. She is bilingual and does not feel that she needs an interpreter today. 6 months ago with upper abdominal pain on the RIGHT side. Felt nauseated. Felt bloating. Radiated to her back. Woke her up in the middle night. Ibuprofen did not help. Not consistent with heartburn or reflux. Not related to physical activity. She's had repeated attacks. 2 in the last month for severe enough to go to the emergency room. She an option which showed some common bile duct dilation as well as some adenomyomatosis and cholesterolosis of the gallbladder. She had another severe attack after having some pizza slices. Can emergency room last month., Bile duct were normal. Surgical consultation recommended. She denies any heartburn or reflux. No history of Crohn's. No ulcerative colitis. Normally has a bowel movement every day. 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 Can walk several miles without difficulty. No history of heart or lung problems. No exertional pain. No coffee-ground emesis. No history of ulcers. She had a C-section but no other abdominal surgeries. The attacks persist. CLINICAL DATA: Right upper quadrant abdominal pain with nausea and bloating for 1 day. EXAM: US ABDOMEN LIMITED - RIGHT UPPER QUADRANT COMPARISON: 10/13/2014 FINDINGS: Gallbladder: No gallstones or wall thickening visualized. No sonographic  Murphy sign noted. A few tiny areas of comet tail artifact associated with the gallbladder wall are consistent with cholesterolosis as previously described. Common bile duct: Diameter: 8 mm (previously 11 mm) Liver: No focal lesion identified. Within normal limits in parenchymal echogenicity. IMPRESSION: 1. Unchanged appearance of the gallbladder with evidence of adenomyomatosis. 2. Mild common bile duct dilatation, slightly decreased from prior. Electronically Signed By: Logan Bores On: 11/03/2014 02:45  Vitals Height Weight BMI (Calculated) 5\' 4"  (1.626 m) 68.947 kg (152 lb) 26.2 Interpretation Summary CLINICAL DATA: Right upper quadrant abdominal pain with nausea and bloating for 1 day. EXAM: US ABDOMEN LIMITED - RIGHT UPPER QUADRANT COMPARISON: 10/13/2014 FINDINGS: Gallbladder: No gallstones or wall thickening visualized. No sonographic Murphy sign noted. A few tiny areas of comet tail artifact associated with the gallbladder wall are consistent with cholesterolosis as previously described. Common bile duct: Diameter: 8 mm (previously 11 mm) Liver: No focal lesion identified. Within normal limits in parenchymal echogenicity. IMPRESSION: 1. Unchanged appearance of the gallbladder with evidence of adenomyomatosis. 2. Mild common bile duct dilatation, slightly decreased from prior. Electronically Signed By: Logan Bores On: 11/03/2014 02:45    Other Problems Lars Mage Spillers, CMA; 11/24/2014 9:48 AM) Hemorrhoids  Past Surgical History Illene Regulus, CMA; 11/24/2014 9:48 AM) Cesarean Section - 1  Diagnostic Studies History Lars Mage Spillers, CMA; 11/24/2014 9:48 AM) Colonoscopy never Pap Smear 1-5 years ago  Allergies Lars Mage Spillers, CMA; 11/24/2014 9:50 AM) No Known Drug Allergies05/03/2015  Medication History (Alisha Spillers, CMA; 11/24/2014 9:51 AM) Multivitamins (Oral) Active. Medications Reconciled  Social History Illene Regulus, CMA;  11/24/2014 9:48 AM) Caffeine use Tea. No alcohol use No drug use Tobacco use Never smoker.  Family History Illene Regulus, [place]; 11/24/2014 9:48  AM) Arthritis Mother. Depression Mother. Hypertension Mother. Migraine Headache Sister.  Pregnancy / Birth History Illene Regulus, CMA; 11/24/2014 9:48 AM) Gravida 4 Maternal age 78-25 Para 4  Review of Systems Lars Mage Spillers CMA; 11/24/2014 9:48 AM) General Present- Weight Gain. Not Present- Appetite Loss, Chills, Fatigue, Fever, Night Sweats and Weight Loss. Skin Present- Rash. Not Present- Change in Wart/Mole, Dryness, Hives, Jaundice, New Lesions, Non-Healing Wounds and Ulcer. HEENT Present- Earache, Seasonal Allergies and Sore Throat. Not Present- Hearing Loss, Hoarseness, Nose Bleed, Oral Ulcers, Ringing in the Ears, Sinus Pain, Visual Disturbances, Wears glasses/contact lenses and Yellow Eyes. Respiratory Not Present- Bloody sputum, Chronic Cough, Difficulty Breathing, Snoring and Wheezing. Breast Not Present- Breast Mass, Breast Pain, Nipple Discharge and Skin Changes. Cardiovascular Not Present- Chest Pain, Difficulty Breathing Lying Down, Leg Cramps, Palpitations, Rapid Heart Rate, Shortness of Breath and Swelling of Extremities. Gastrointestinal Present- Abdominal Pain, Gets full quickly at meals and Hemorrhoids. Not Present- Bloating, Bloody Stool, Change in Bowel Habits, Chronic diarrhea, Constipation, Difficulty Swallowing, Excessive gas, Indigestion, Nausea, Rectal Pain and Vomiting. Female Genitourinary Present- Frequency, Nocturia and Pelvic Pain. Not Present- Painful Urination and Urgency. Musculoskeletal Present- Back Pain and Muscle Weakness. Not Present- Joint Pain, Joint Stiffness, Muscle Pain and Swelling of Extremities. Neurological Present- Headaches. Not Present- Decreased Memory, Fainting, Numbness, Seizures, Tingling, Tremor, Trouble walking and Weakness. Psychiatric Present- Change in Sleep Pattern and  Depression. Not Present- Anxiety, Bipolar, Fearful and Frequent crying.   Vitals (Alisha Spillers CMA; 11/24/2014 9:50 AM) 11/24/2014 9:48 AM Weight: 152 lb Height: 64in Body Surface Area: 1.76 m Body Mass Index: 26.09 kg/m Temp.: 97.17F(Oral)  Pulse: 59 (Regular)  BP: 102/70 (Sitting, Left Arm, Standard)    Physical Exam [name]; 11/24/2014 10:26 AM) General Mental Status-Alert. General Appearance-Not in acute distress, Not Sickly. Orientation-Oriented X3. Hydration-Well hydrated. Voice-Normal.  Integumentary Global Assessment Upon inspection and palpation of skin surfaces of the - Axillae: non-tender, no inflammation or ulceration, no drainage. and Distribution of scalp and body hair is normal. General Characteristics Temperature - normal warmth is noted.  Head and Neck Head-normocephalic, atraumatic with no lesions or palpable masses. Face Global Assessment - atraumatic, no absence of expression. Neck Global Assessment - no abnormal movements, no bruit auscultated on the right, no bruit auscultated on the left, no decreased range of motion, non-tender. Trachea-midline. Thyroid Gland Characteristics - non-tender.  Eye Eyeball - Left-Extraocular movements intact, No Nystagmus. Eyeball - Right-Extraocular movements intact, No Nystagmus. Cornea - Left-No Hazy. Cornea - Right-No Hazy. Sclera/Conjunctiva - Left-No scleral icterus, No Discharge. Sclera/Conjunctiva - Right-No scleral icterus, No Discharge. Pupil - Left-Direct reaction to light normal. Pupil - Right-Direct reaction to light normal.  ENMT Ears Pinna - Left - no drainage observed, no generalized tenderness observed. Right - no drainage observed, no generalized tenderness observed. Nose and Sinuses External Inspection of the Nose - no destructive lesion observed. Inspection of the nares - Left - quiet respiration. Right - quiet respiration. Mouth and  Throat Lips - Upper Lip - no fissures observed, no pallor noted. Lower Lip - no fissures observed, no pallor noted. Nasopharynx - no discharge present. Oral Cavity/Oropharynx - Tongue - no dryness observed. Oral Mucosa - no cyanosis observed. Hypopharynx - no evidence of airway distress observed.  Chest and Lung Exam Inspection Movements - Normal and Symmetrical. Accessory muscles - No use of accessory muscles in breathing. Palpation Palpation of the chest reveals - Non-tender. Auscultation Breath sounds - Normal and Clear.  Cardiovascular Auscultation Rhythm - Regular. Murmurs &  Other Heart Sounds - Auscultation of the heart reveals - No Murmurs and No Systolic Clicks.  Abdomen Inspection Inspection of the abdomen reveals - No Visible peristalsis and No Abnormal pulsations. Umbilicus - No Bleeding, No Urine drainage. Palpation/Percussion Palpation and Percussion of the abdomen reveal - Soft, Non Tender, No Rebound tenderness, No Rigidity (guarding) and No Cutaneous hyperesthesia. Note: Mild epigastric and RIGHT upper quadrant discomfort. No true Murphy sign. No diastases. No umbilical hernia.   Female Genitourinary Sexual Maturity Tanner 5 - Adult hair pattern. Note: No vaginal bleeding nor discharge. No inguinal hernias. No lower abdominal pain   Peripheral Vascular Upper Extremity Inspection - Left - No Cyanotic nailbeds, Not Ischemic. Right - No Cyanotic nailbeds, Not Ischemic.  Neurologic Neurologic evaluation reveals -normal attention span and ability to concentrate, able to name objects and repeat phrases. Appropriate fund of knowledge , normal sensation and normal coordination. Mental Status Affect - not angry, not paranoid. Cranial Nerves-Normal Bilaterally. Gait-Normal.  Neuropsychiatric Mental status exam performed with findings of-able to articulate well with normal speech/language, rate, volume and coherence, thought content normal with ability to  perform basic computations and apply abstract reasoning and no evidence of hallucinations, delusions, obsessions or homicidal/suicidal ideation.  Musculoskeletal Global Assessment Spine, Ribs and Pelvis - no instability, subluxation or laxity. Right Upper Extremity - no instability, subluxation or laxity.  Lymphatic Head & Neck  General Head & Neck Lymphatics: Bilateral - Description - No Localized lymphadenopathy. Axillary  General Axillary Region: Bilateral - Description - No Localized lymphadenopathy. Femoral & Inguinal  Generalized Femoral & Inguinal Lymphatics: Left - Description - No Localized lymphadenopathy. Right - Description - No Localized lymphadenopathy.    Assessment & Plan [name]; 11/24/2014 10:25 AM) CHRONIC CHOLECYSTITIS (575.11  K81.1) Impression: Classic episodes of biliary colic with adenomyomatosis & cholesterolosis of gallbladder. I believe she would benefit from cholecystectomy. Rest of differential diagnosis seems unlikely. Current Plans  Schedule for Surgery Written instructions provided Discussed regular exercise with patient. The anatomy & physiology of hepatobiliary & pancreatic function was discussed. The pathophysiology of gallbladder dysfunction was discussed. Natural history risks without surgery was discussed. I feel the risks of no intervention will lead to serious problems that outweigh the operative risks; therefore, I recommended cholecystectomy to remove the pathology. I explained laparoscopic techniques with possible need for an open approach. Probable cholangiogram to evaluate the bilary tract was explained as well.  Risks such as bleeding, infection, abscess, leak, injury to other organs, need for further treatment, heart attack, death, and other risks were discussed. I noted a good likelihood this will help address the problem. Possibility that this will not correct all abdominal symptoms was explained. Goals of post-operative  recovery were discussed as well. We will work to minimize complications. An educational handout further explaining the pathology and treatment options was given as well. Questions were answered. The patient expresses understanding & wishes to proceed with surgery. Pt Education - [facility] Laparosopic Post Op HCI (Nickcole Bralley) Pt Education - [facility] Pain Control (Wylie Russon) Pt Education - [facility] Good Bowel Health (Ladarryl Wrage) Pt Education - Cholecystitis: gall bladder  Adin Hector, M.D., F.A.C.S. Gastrointestinal and Minimally Invasive Surgery [facility] [address] [place], [place] [address] [phone] Main / Paging

## 2014-12-25 NOTE — Anesthesia Postprocedure Evaluation (Signed)
  Anesthesia Post-op Note  Patient: Wanda Powers  Procedure(s) Performed: Procedure(s) (LRB): LAPAROSCOPIC CHOLECYSTECTOMY SINGLE SITE WITH INTRAOPERATIVE CHOLANGIOGRAM (N/A)  Patient Location: PACU  Anesthesia Type: General  Level of Consciousness: awake and alert   Airway and Oxygen Therapy: Patient Spontanous Breathing  Post-op Pain: mild  Post-op Assessment: Post-op Vital signs reviewed, Patient's Cardiovascular Status Stable, Respiratory Function Stable, Patent Airway and No signs of Nausea or vomiting  Last Vitals:  Filed Vitals:   12/25/14 1617  BP: 126/63  Pulse: 73  Temp: 36.5 C  Resp: 16    Post-op Vital Signs: stable   Complications: No apparent anesthesia complications

## 2014-12-25 NOTE — Progress Notes (Signed)
Medication for itching given on face-right arm- left leg- no rash nor whelps noted.

## 2014-12-25 NOTE — Progress Notes (Signed)
Itching stopped  

## 2014-12-25 NOTE — Anesthesia Procedure Notes (Signed)
Procedure Name: Intubation Date/Time: 12/25/2014 1:20 PM Performed by: Chandra Batch A Pre-anesthesia Checklist: Emergency Drugs available, Patient identified, Timeout performed, Suction available and Patient being monitored Patient Re-evaluated:Patient Re-evaluated prior to inductionOxygen Delivery Method: Circle system utilized Preoxygenation: Pre-oxygenation with 100% oxygen Intubation Type: IV induction Ventilation: Mask ventilation without difficulty Laryngoscope Size: Mac and 3 Grade View: Grade I Tube type: Oral Tube size: 7.0 mm Number of attempts: 1 Airway Equipment and Method: Stylet Placement Confirmation: ETT inserted through vocal cords under direct vision,  positive ETCO2 and breath sounds checked- equal and bilateral Secured at: 21 cm Tube secured with: Tape Dental Injury: Teeth and Oropharynx as per pre-operative assessment

## 2014-12-25 NOTE — Discharge Instructions (Signed)
LAPAROSCOPIC SURGERY: POST OP INSTRUCTIONS ° °1. DIET: Follow a light bland diet the first 24 hours after arrival home, such as soup, liquids, crackers, etc.  Be sure to include lots of fluids daily.  Avoid fast food or heavy meals as your are more likely to get nauseated.  Eat a low fat the next few days after surgery.   °2. Take your usually prescribed home medications unless otherwise directed. °3. PAIN CONTROL: °a. Pain is best controlled by a usual combination of three different methods TOGETHER: °i. Ice/Heat °ii. Over the counter pain medication °iii. Prescription pain medication °b. Most patients will experience some swelling and bruising around the incisions.  Ice packs or heating pads (30-60 minutes up to 6 times a day) will help. Use ice for the first few days to help decrease swelling and bruising, then switch to heat to help relax tight/sore spots and speed recovery.  Some people prefer to use ice alone, heat alone, alternating between ice & heat.  Experiment to what works for you.  Swelling and bruising can take several weeks to resolve.   °c. It is helpful to take an over-the-counter pain medication regularly for the first few weeks.  Choose one of the following that works best for you: °i. Naproxen (Aleve, etc)  Two 220mg tabs twice a day °ii. Ibuprofen (Advil, etc) Three 200mg tabs four times a day (every meal & bedtime) °iii. Acetaminophen (Tylenol, etc) 500-650mg four times a day (every meal & bedtime) °d. A  prescription for pain medication (such as oxycodone, hydrocodone, etc) should be given to you upon discharge.  Take your pain medication as prescribed.  °i. If you are having problems/concerns with the prescription medicine (does not control pain, nausea, vomiting, rash, itching, etc), please call us [phone] to see if we need to switch you to a different pain medicine that will work better for you and/or control your side effect better. °ii. If you need a refill on your pain medication,  please contact your pharmacy.  They will contact our office to request authorization. Prescriptions will not be filled after 5 pm or on week-ends. °4. Avoid getting constipated.  Between the surgery and the pain medications, it is common to experience some constipation.  Increasing fluid intake and taking a fiber supplement (such as Metamucil, Citrucel, FiberCon, MiraLax, etc) 1-2 times a day regularly will usually help prevent this problem from occurring.  A mild laxative (prune juice, Milk of Magnesia, MiraLax, etc) should be taken according to package directions if there are no bowel movements after 48 hours.   °5. Watch out for diarrhea.  If you have many loose bowel movements, simplify your diet to bland foods & liquids for a few days.  Stop any stool softeners and decrease your fiber supplement.  Switching to mild anti-diarrheal medications (Kayopectate, Pepto Bismol) can help.  If this worsens or does not improve, please call us. °6. Wash / shower every day.  You may shower over the dressings as they are waterproof.  Continue to shower over incision(s) after the dressing is off. °7. Remove your waterproof bandages 5 days after surgery.  You may leave the incision open to air.  You may replace a dressing/Band-Aid to cover the incision for comfort if you wish.  °8. ACTIVITIES as tolerated:   °a. You may resume regular (light) daily activities beginning the next day--such as daily self-care, walking, climbing stairs--gradually increasing activities as tolerated.  If you can walk 30 minutes without difficulty, it   is safe to try more intense activity such as jogging, treadmill, bicycling, low-impact aerobics, swimming, etc. b. Save the most intensive and strenuous activity for last such as sit-ups, heavy lifting, contact sports, etc  Refrain from any heavy lifting or straining until you are off narcotics for pain control.   c. DO NOT PUSH THROUGH PAIN.  Let pain be your guide: If it hurts to do something, don't  do it.  Pain is your body warning you to avoid that activity for another week until the pain goes down. d. You may drive when you are no longer taking prescription pain medication, you can comfortably wear a seatbelt, and you can safely maneuver your car and apply brakes. e. Dennis Bast may have sexual intercourse when it is comfortable.  9. FOLLOW UP in our office a. Please call [facility] at [phone] to set up an appointment to see your surgeon in the office for a follow-up appointment approximately 2-3 weeks after your surgery. b. Make sure that you call for this appointment the day you arrive home to insure a convenient appointment time. 10. IF YOU HAVE DISABILITY OR FAMILY LEAVE FORMS, BRING THEM TO THE OFFICE FOR PROCESSING.  DO NOT GIVE THEM TO YOUR DOCTOR.   WHEN TO CALL us [phone]: 1. Poor pain control 2. Reactions / problems with new medications (rash/itching, nausea, etc)  3. Fever over 101.5 F (38.5 C) 4. Inability to urinate 5. Nausea and/or vomiting 6. Worsening swelling or bruising 7. Continued bleeding from incision. 8. Increased pain, redness, or drainage from the incision   The clinic staff is available to answer your questions during regular business hours (8:30am-5pm).  Please dont hesitate to call and ask to speak to one of our nurses for clinical concerns.   If you have a medical emergency, go to the nearest emergency room or call 911.  A surgeon from [facility] is always on call at the [facility], [place], [place], [place], [place]  [id number] ? MAIN: [phone] ? TOLL FREE: [phone] ?  FAX [phone] [email or web address]  Cholecystitis Cholecystitis is an inflammation of your gallbladder. It is usually caused by a buildup of gallstones or sludge (cholelithiasis) in your gallbladder. The gallbladder stores a fluid that helps digest fats (bile). Cholecystitis is serious and needs  treatment right away.  CAUSES   Gallstones. Gallstones can block the tube that leads to your gallbladder, causing bile to build up. As bile builds up, the gallbladder becomes inflamed.  Bile duct problems, such as blockage from scarring or kinking.  Tumors. Tumors can stop bile from leaving your gallbladder correctly, causing bile to build up. As bile builds up, the gallbladder becomes inflamed. SYMPTOMS   Nausea.  Vomiting.  Abdominal pain, especially in the upper right area of your abdomen.  Abdominal tenderness or bloating.  Sweating.  Chills.  Fever.  Yellowing of the skin and the whites of the eyes (jaundice). DIAGNOSIS  Your caregiver may order blood tests to look for infection or gallbladder problems. Your caregiver may also order imaging tests, such as an ultrasound or computed tomography (CT) scan. Further tests may include a hepatobiliary iminodiacetic acid (HIDA) scan. This scan allows your caregiver to see your bile move from the liver to the gallbladder and to the small intestine. TREATMENT  A hospital stay is usually necessary to lessen the inflammation of your gallbladder. You may be required to not eat or drink (fast) for a  certain amount of time. You may be given medicine to treat pain or an antibiotic medicine to treat an infection. Surgery may be needed to remove your gallbladder (cholecystectomy) once the inflammation has gone down. Surgery may be needed right away if you develop complications such as death of gallbladder tissue (gangrene) or a tear (perforation) of the gallbladder.  [place] care will depend on your treatment. In general:  If you were given antibiotics, take them as directed. Finish them even if you start to feel better.  Only take over-the-counter or prescription medicines for pain, discomfort, or fever as directed by your caregiver.  Follow a low-fat diet until you see your caregiver again.  Keep all follow-up visits as  directed by your caregiver. SEEK IMMEDIATE MEDICAL CARE IF:   Your pain is increasing and not controlled by medicines.  Your pain moves to another part of your abdomen or to your back.  You have a fever.  You have nausea and vomiting. MAKE SURE YOU:  Understand these instructions.  Will watch your condition.  Will get help right away if you are not doing well or get worse. Document Released: 07/04/2005 Document Revised: 09/26/2011 Document Reviewed: 05/20/2011 [facility] Patient Information 2015 [place], [place]. This information is not intended to replace advice given to you by your health care provider. Make sure you discuss any questions you have with your health care provider.  Managing Pain  Pain after surgery or related to activity is often due to strain/injury to muscle, tendon, nerves and/or incisions.  This pain is usually short-term and will improve in a few months.   Many people find it helpful to do the following things TOGETHER to help speed the process of healing and to get back to regular activity more quickly:  1. Avoid heavy physical activity at first a. No lifting greater than 20 pounds at first, then increase to lifting as tolerated over the next few weeks b. Do not push through the pain.  Listen to your body and avoid positions and maneuvers than reproduce the pain.  Wait a few days before trying something more intense c. Walking is okay as tolerated, but go slowly and stop when getting sore.  If you can walk 30 minutes without stopping or pain, you can try more intense activity (running, jogging, aerobics, cycling, swimming, treadmill, sex, sports, weightlifting, etc ) d. Remember: If it hurts to do it, then dont do it!  2. Take Anti-inflammatory medication a. Choose ONE of the following over-the-counter medications: i.            Acetaminophen 500mg  tabs (Tylenol) 1-2 pills with every meal and just before bedtime (avoid if you have liver problems) ii.             Naproxen 220mg  tabs (ex. Aleve) 1-2 pills twice a day (avoid if you have kidney, stomach, IBD, or bleeding problems) iii. Ibuprofen 200mg  tabs (ex. Advil, Motrin) 3-4 pills with every meal and just before bedtime (avoid if you have kidney, stomach, IBD, or bleeding problems) b. Take with food/snack around the clock for 1-2 weeks i. This helps the muscle and nerve tissues become less irritable and calm down faster  3. Use a Heating pad or Ice/Cold Pack a. 4-6 times a day b. May use warm bath/hottub  or showers  4. Try Gentle Massage and/or Stretching  a. at the area of pain many times a day b. stop if you feel pain - do not overdo it  Try these steps together to help you body heal faster and avoid making things get worse.  Doing just one of these things may not be enough.    If you are not getting better after two weeks or are noticing you are getting worse, contact our office for further advice; we may need to re-evaluate you & see what other things we can do to help.  GETTING TO GOOD BOWEL HEALTH. Irregular bowel habits such as constipation and diarrhea can lead to many problems over time.  Having one soft bowel movement a day is the most important way to prevent further problems.  The anorectal canal is designed to handle stretching and feces to safely manage our ability to get rid of solid waste (feces, poop, stool) out of our body.  BUT, hard constipated stools can act like ripping concrete bricks and diarrhea can be a burning fire to this very sensitive area of our body, causing inflamed hemorrhoids, anal fissures, increasing risk is perirectal abscesses, abdominal pain/bloating, an making irritable bowel worse.      The goal: ONE SOFT BOWEL MOVEMENT A DAY!  To have soft, regular bowel movements:   Drink plenty of fluids, consider 4-6 tall glasses of water a day.    Take plenty of fiber.  Fiber is the undigested part of plant food that passes into the colon, acting s natures broom to  encourage bowel motility and movement.  Fiber can absorb and hold large amounts of water. This results in a larger, bulkier stool, which is soft and easier to pass. Work gradually over several weeks up to 6 servings a day of fiber (25g a day even more if needed) in the form of: o Vegetables -- Root (potatoes, carrots, turnips), leafy green (lettuce, salad greens, celery, spinach), or cooked high residue (cabbage, broccoli, etc) o Fruit -- Fresh (unpeeled skin & pulp), Dried (prunes, apricots, cherries, etc ),  or stewed ( applesauce)  o Whole grain breads, pasta, etc (whole wheat)  o Bran cereals   Bulking Agents -- This type of water-retaining fiber generally is easily obtained each day by one of the following:  o Psyllium bran -- The psyllium plant is remarkable because its ground seeds can retain so much water. This product is available as Metamucil, Konsyl, Effersyllium, Per Diem Fiber, or the less expensive generic preparation in drug and health food stores. Although labeled a laxative, it really is not a laxative.  o Methylcellulose -- This is another fiber derived from wood which also retains water. It is available as Citrucel. o Polyethylene Glycol - and artificial fiber commonly called Miralax or Glycolax.  It is helpful for people with gassy or bloated feelings with regular fiber o Flax Seed - a less gassy fiber than psyllium  No reading or other relaxing activity while on the toilet. If bowel movements take longer than 5 minutes, you are too constipated  AVOID CONSTIPATION.  High fiber and water intake usually takes care of this.  Sometimes a laxative is needed to stimulate more frequent bowel movements, but   Laxatives are not a good long-term solution as it can wear the colon out.  They can help jump-start bowels if constipated, but should be relied on constantly without discussing with your doctor o Osmotics (Milk of Magnesia, Fleets phosphosoda, Magnesium citrate, MiraLax, GoLytely)  are safer than  o Stimulants (Senokot, Castor Oil, Dulcolax, Ex Lax)    o Avoid taking laxatives for more than 7 days in a row.  IF SEVERELY CONSTIPATED, try a Bowel Retraining Program: o Do not use laxatives.  o Eat a diet high in roughage, such as bran cereals and leafy vegetables.  o Drink six (6) ounces of prune or apricot juice each morning.  o Eat two (2) large servings of stewed fruit each day.  o Take one (1) heaping tablespoon of a psyllium-based bulking agent twice a day. Use sugar-free sweetener when possible to avoid excessive calories.  o Eat a normal breakfast.  o Set aside 15 minutes after breakfast to sit on the toilet, but do not strain to have a bowel movement.  o If you do not have a bowel movement by the third day, use an enema and repeat the above steps.   Controlling diarrhea o Switch to liquids and simpler foods for a few days to avoid stressing your intestines further. o Avoid dairy products (especially milk & ice cream) for a short time.  The intestines often can lose the ability to digest lactose when stressed. o Avoid foods that cause gassiness or bloating.  Typical foods include beans and other legumes, cabbage, broccoli, and dairy foods.  Every person has some sensitivity to other foods, so listen to our body and avoid those foods that trigger problems for you. o Adding fiber (Citrucel, Metamucil, psyllium, Miralax) gradually can help thicken stools by absorbing excess fluid and retrain the intestines to act more normally.  Slowly increase the dose over a few weeks.  Too much fiber too soon can backfire and cause cramping & bloating. o Probiotics (such as active yogurt, Align, etc) may help repopulate the intestines and colon with normal bacteria and calm down a sensitive digestive tract.  Most studies show it to be of mild help, though, and such products can be costly. o Medicines: - Bismuth subsalicylate (ex. Kayopectate, Pepto Bismol) every 30 minutes for up to 6  doses can help control diarrhea.  Avoid if pregnant. - Loperamide (Immodium) can slow down diarrhea.  Start with two tablets (4mg  total) first and then try one tablet every 6 hours.  Avoid if you are having fevers or severe pain.  If you are not better or start feeling worse, stop all medicines and call your doctor for advice o Call your doctor if you are getting worse or not better.  Sometimes further testing (cultures, endoscopy, X-ray studies, bloodwork, etc) may be needed to help diagnose and treat the cause of the diarrhea.  TROUBLESHOOTING IRREGULAR BOWELS 1) Avoid extremes of bowel movements (no bad constipation/diarrhea) 2) Miralax 17gm mixed in 8oz. water or juice-daily. May use BID as needed.  3) Gas-x,Phazyme, etc. as needed for gas & bloating.  4) Soft,bland diet. No spicy,greasy,fried foods.  5) Prilosec over-the-counter as needed  6) May hold gluten/wheat products from diet to see if symptoms improve.  7)  May try probiotics (Align, Activa, etc) to help calm the bowels down 7) If symptoms become worse call back immediately.   Cholecystitis Cholecystitis is an inflammation of your gallbladder. It is usually caused by a buildup of gallstones or sludge (cholelithiasis) in your gallbladder. The gallbladder stores a fluid that helps digest fats (bile). Cholecystitis is serious and needs treatment right away.  CAUSES   Gallstones. Gallstones can block the tube that leads to your gallbladder, causing bile to build up. As bile builds up, the gallbladder becomes inflamed.  Bile duct problems, such as blockage from scarring or kinking.  Tumors. Tumors can stop bile from leaving your gallbladder correctly, causing bile  to build up. As bile builds up, the gallbladder becomes inflamed. SYMPTOMS   Nausea.  Vomiting.  Abdominal pain, especially in the upper right area of your abdomen.  Abdominal tenderness or bloating.  Sweating.  Chills.  Fever.  Yellowing of the skin and the  whites of the eyes (jaundice). DIAGNOSIS  Your caregiver may order blood tests to look for infection or gallbladder problems. Your caregiver may also order imaging tests, such as an ultrasound or computed tomography (CT) scan. Further tests may include a hepatobiliary iminodiacetic acid (HIDA) scan. This scan allows your caregiver to see your bile move from the liver to the gallbladder and to the small intestine. TREATMENT  A hospital stay is usually necessary to lessen the inflammation of your gallbladder. You may be required to not eat or drink (fast) for a certain amount of time. You may be given medicine to treat pain or an antibiotic medicine to treat an infection. Surgery may be needed to remove your gallbladder (cholecystectomy) once the inflammation has gone down. Surgery may be needed right away if you develop complications such as death of gallbladder tissue (gangrene) or a tear (perforation) of the gallbladder.  [place] care will depend on your treatment. In general:  If you were given antibiotics, take them as directed. Finish them even if you start to feel better.  Only take over-the-counter or prescription medicines for pain, discomfort, or fever as directed by your caregiver.  Follow a low-fat diet until you see your caregiver again.  Keep all follow-up visits as directed by your caregiver. SEEK IMMEDIATE MEDICAL CARE IF:   Your pain is increasing and not controlled by medicines.  Your pain moves to another part of your abdomen or to your back.  You have a fever.  You have nausea and vomiting. MAKE SURE YOU:  Understand these instructions.  Will watch your condition.  Will get help right away if you are not doing well or get worse. Document Released: 07/04/2005 Document Revised: 09/26/2011 Document Reviewed: 05/20/2011 [facility] Patient Information 2015 [place], [place]. This information is not intended to replace advice given to you by your health  care provider. Make sure you discuss any questions you have with your health care provider.      General Anesthesia, Care After Refer to this sheet in the next few weeks. These instructions provide you with information on caring for yourself after your procedure. Your health care provider may also give you more specific instructions. Your treatment has been planned according to current medical practices, but problems sometimes occur. Call your health care provider if you have any problems or questions after your procedure. WHAT TO EXPECT AFTER THE PROCEDURE After the procedure, it is typical to experience: Sleepiness. Nausea and vomiting. HOME CARE INSTRUCTIONS For the first 24 hours after general anesthesia: Have a responsible person with you. Do not drive a car. If you are alone, do not take public transportation. Do not drink alcohol. Do not take medicine that has not been prescribed by your health care provider. Do not sign important papers or make important decisions. You may resume a normal diet and activities as directed by your health care provider. Change bandages (dressings) as directed. If you have questions or problems that seem related to general anesthesia, call the hospital and ask for the anesthetist or anesthesiologist on call. SEEK MEDICAL CARE IF: You have nausea and vomiting that continue the day after anesthesia. You develop a rash. SEEK IMMEDIATE MEDICAL CARE IF:  You have difficulty breathing. You have chest pain. You have any allergic problems. Document Released: 10/10/2000 Document Revised: 07/09/2013 Document Reviewed: 01/17/2013 [facility] Patient Information 2015 [place], [place]. This information is not intended to replace advice given to you by your health care provider. Make sure you discuss any questions you have with your health care provider.

## 2014-12-25 NOTE — Anesthesia Preprocedure Evaluation (Signed)
Anesthesia Evaluation  Patient identified by MRN, date of birth, ID band Patient awake    Reviewed: Allergy & Precautions, NPO status , Patient's Chart, lab work & pertinent test results  Airway Mallampati: II  TM Distance: >3 FB Neck ROM: Full    Dental no notable dental hx.    Pulmonary neg pulmonary ROS,  breath sounds clear to auscultation  Pulmonary exam normal       Cardiovascular negative cardio ROS Normal cardiovascular examRhythm:Regular Rate:Normal     Neuro/Psych negative neurological ROS  negative psych ROS   GI/Hepatic negative GI ROS, Neg liver ROS,   Endo/Other  negative endocrine ROSneg diabetes  Renal/GU negative Renal ROS  negative genitourinary   Musculoskeletal negative musculoskeletal ROS (+)   Abdominal   Peds negative pediatric ROS (+)  Hematology negative hematology ROS (+)   Anesthesia Other Findings   Reproductive/Obstetrics negative OB ROS                             Anesthesia Physical Anesthesia Plan  ASA: I  Anesthesia Plan: General   Post-op Pain Management:    Induction: Intravenous  Airway Management Planned: Oral ETT  Additional Equipment:   Intra-op Plan:   Post-operative Plan: Extubation in OR  Informed Consent: I have reviewed the patients History and Physical, chart, labs and discussed the procedure including the risks, benefits and alternatives for the proposed anesthesia with the patient or authorized representative who has indicated his/her understanding and acceptance.   Dental advisory given  Plan Discussed with: CRNA  Anesthesia Plan Comments:         Anesthesia Quick Evaluation

## 2014-12-25 NOTE — Interval H&P Note (Signed)
History and Physical Interval Note:  12/25/2014 12:27 PM  Wanda Powers  has presented today for surgery, with the diagnosis of Biliary Colic  The various methods of treatment have been discussed with the patient and family. After consideration of risks, benefits and other options for treatment, the patient has consented to  Procedure(s): [place] CHOLANGIOGRAM (N/A) as a surgical intervention .  The patient's history has been reviewed, patient examined, no change in status, stable for surgery.  I have reviewed the patient's chart and labs.  Questions were answered to the patient's satisfaction.     [name]

## 2014-12-26 ENCOUNTER — Encounter (HOSPITAL_COMMUNITY): Payer: Self-pay | Admitting: Surgery

## 2015-08-31 ENCOUNTER — Ambulatory Visit: Payer: No Typology Code available for payment source

## 2015-09-01 ENCOUNTER — Encounter: Payer: No Typology Code available for payment source | Admitting: Family Medicine

## 2015-10-23 IMAGING — US US ABDOMEN LIMITED
1 series · 14 of 25 positions shown · non-contrast
Comparison: 10/13/2014

CLINICAL DATA: Right upper quadrant abdominal pain with nausea and
bloating for 1 day.

EXAM:
US ABDOMEN LIMITED - RIGHT UPPER QUADRANT

[Series 1: us abdomen limited · 0.27mm/px · 14 of 56 slices shown]
[im 1/56]
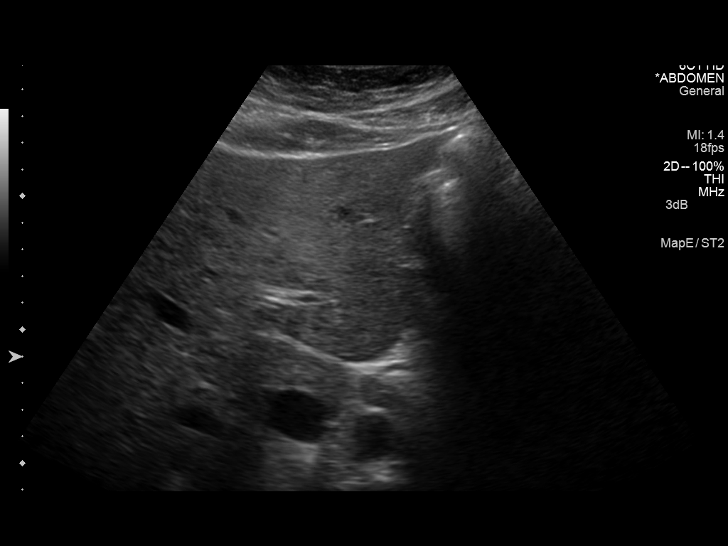
[im 5/56]
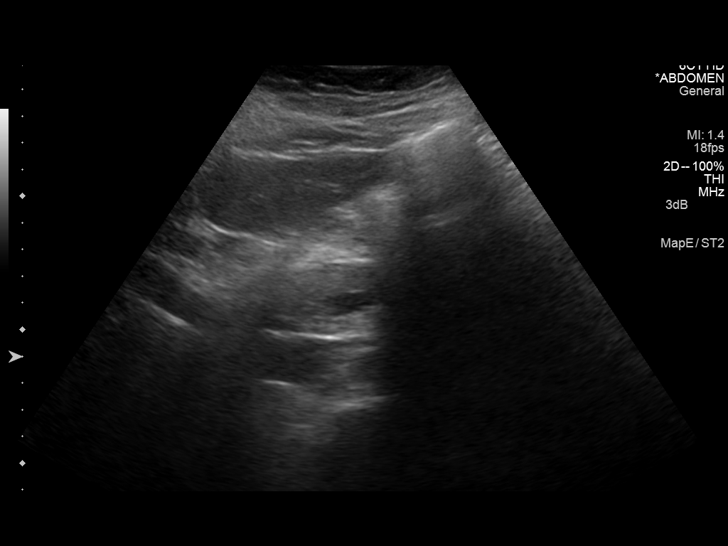
[im 10/56]
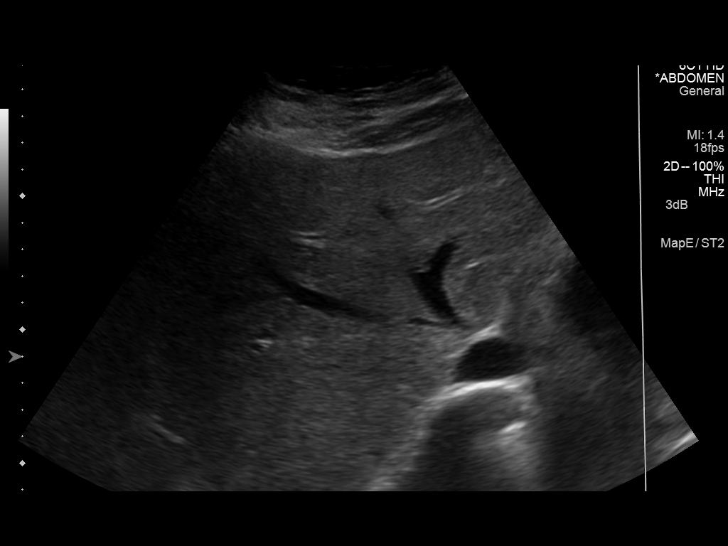
[im 14/56]
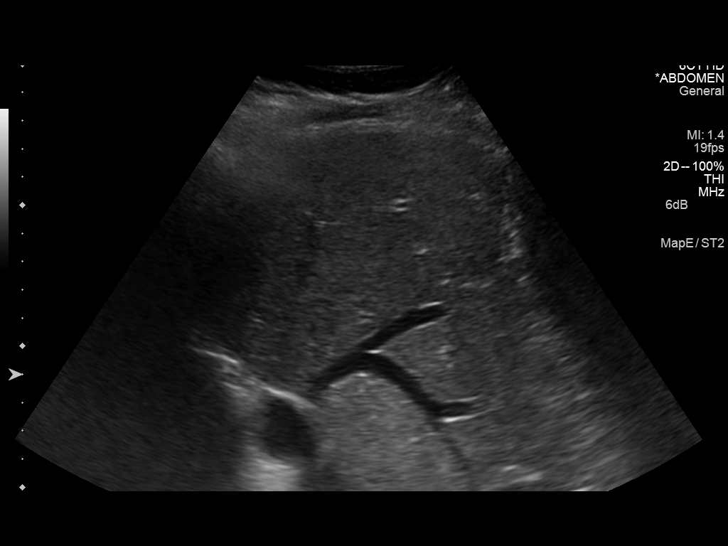
[im 19/56]
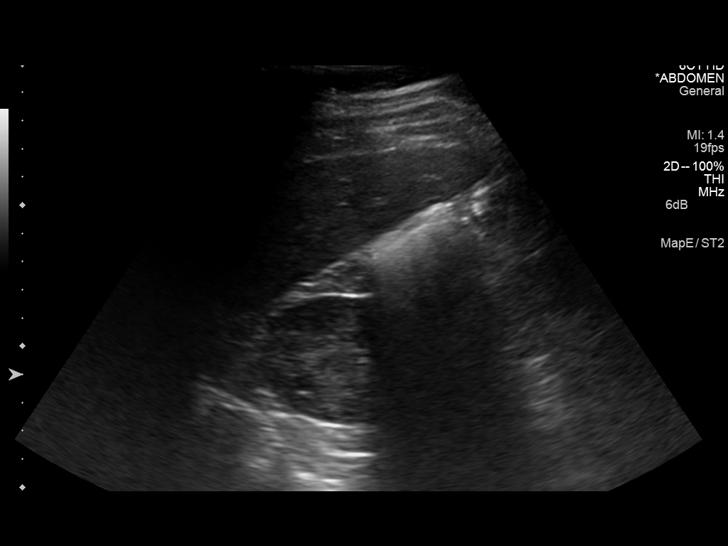
[im 21/56]
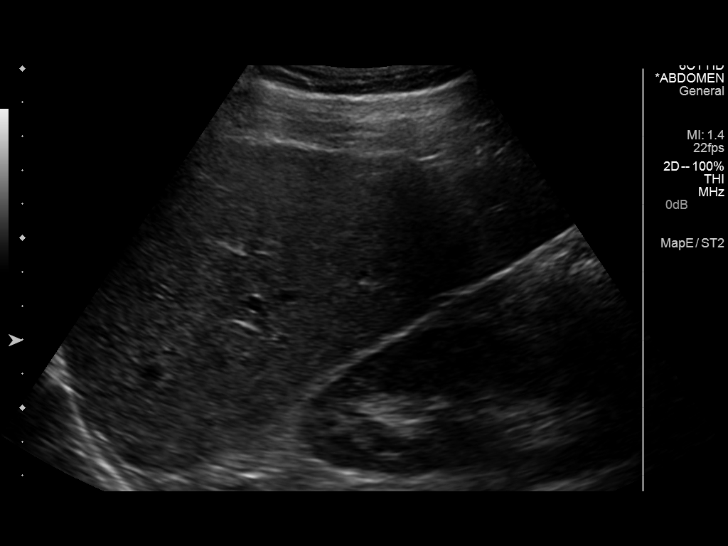
[im 26/56]
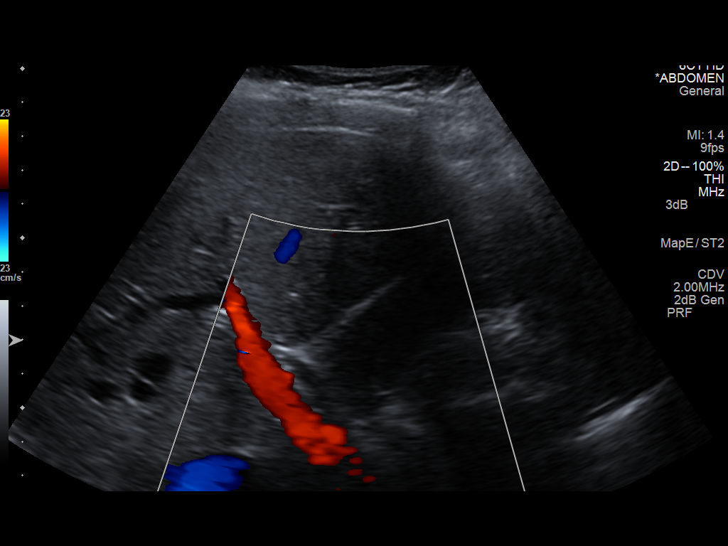
[im 30/56]
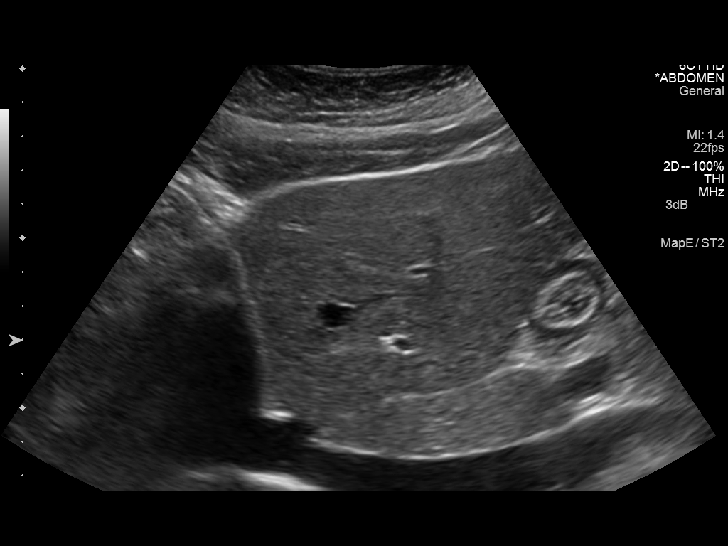
[im 35/56]
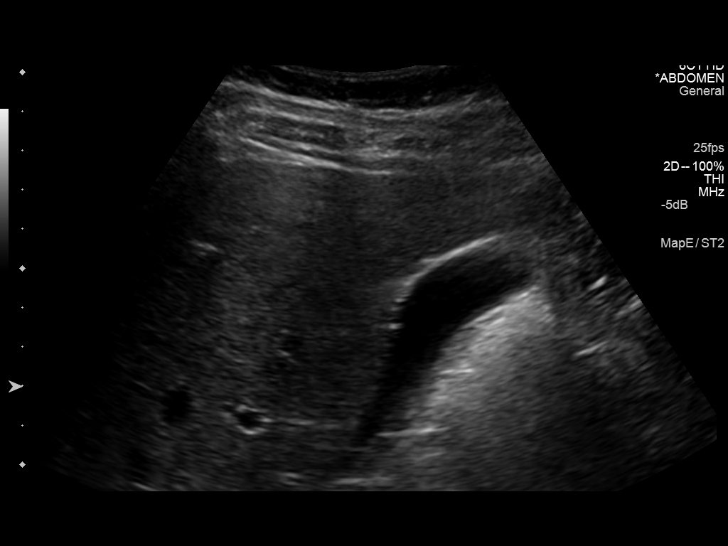
[im 37/56]
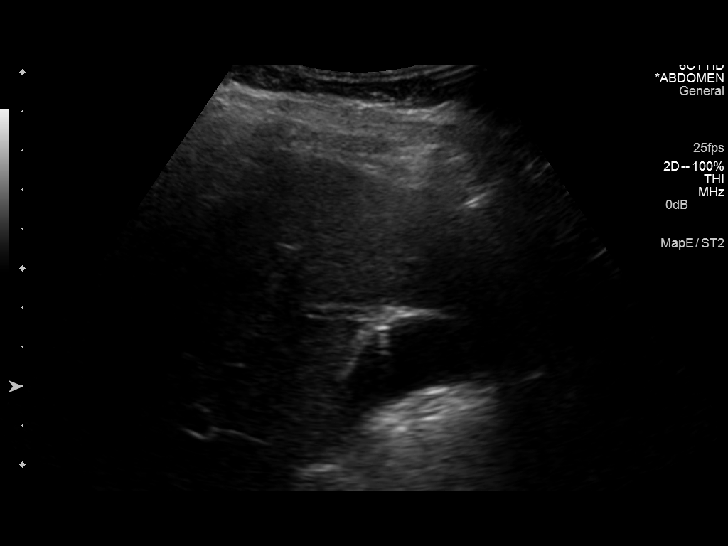
[im 42/56]
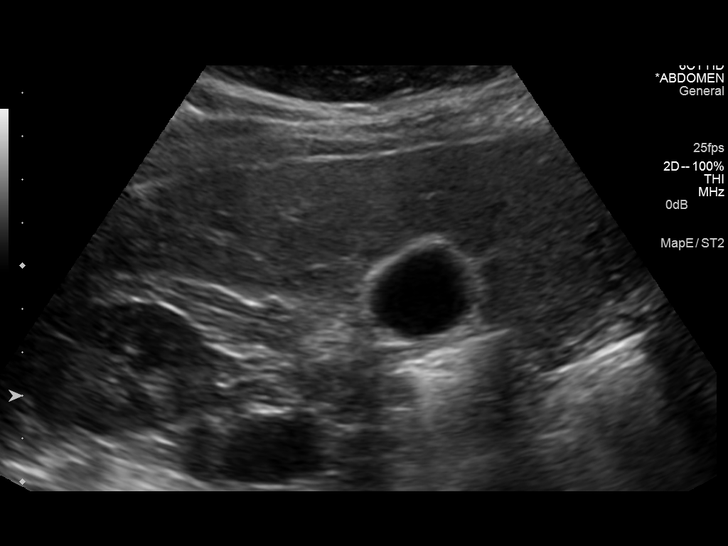
[im 46/56]
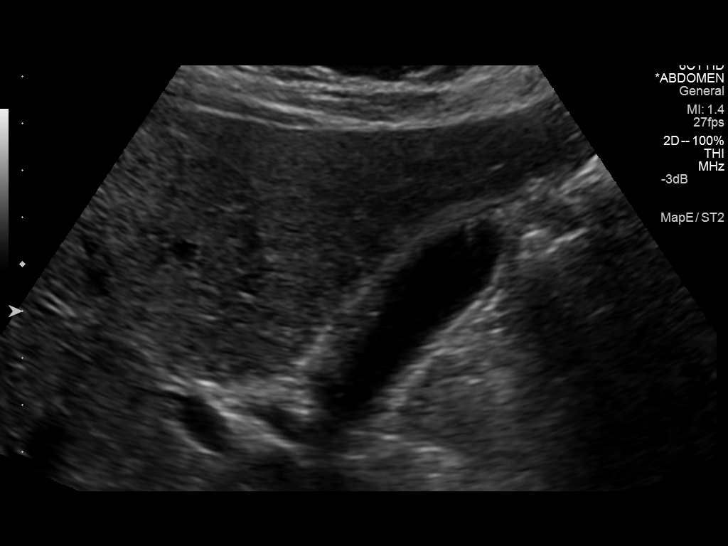
[im 51/56]
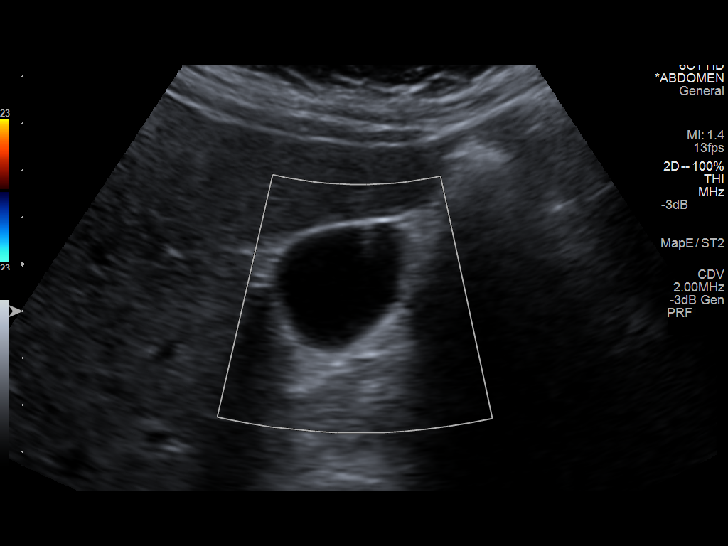
[im 56/56]
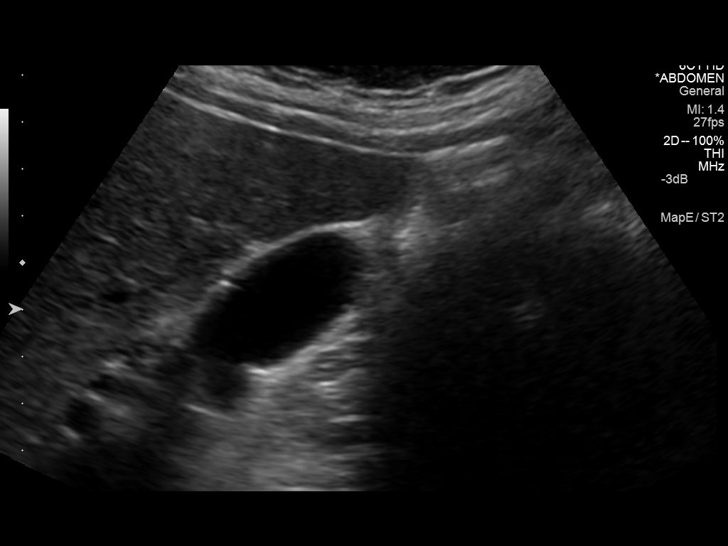

[14 of 25 positions shown; findings below may reference images not displayed]

FINDINGS: Gallbladder:

No gallstones or wall thickening visualized. No sonographic Murphy
sign noted. A few tiny areas of comet tail artifact associated with
the gallbladder wall are consistent with cholesterolosis as
previously described.

Common bile duct:

Diameter: 8 mm (previously 11 mm)

Liver:

No focal lesion identified. Within normal limits in parenchymal
echogenicity.
IMPRESSION: 1. Unchanged appearance of the gallbladder with evidence of
adenomyomatosis.
2. Mild common bile duct dilatation, slightly decreased from prior.

## 2018-01-10 ENCOUNTER — Emergency Department (HOSPITAL_COMMUNITY)
Admission: EM | Admit: 2018-01-10 | Discharge: 2018-01-10 | Disposition: A | Discharge status: Home / Self Care | Payer: Self-pay | Attending: Emergency Medicine | Admitting: Emergency Medicine

## 2018-01-10 ENCOUNTER — Encounter (HOSPITAL_COMMUNITY): Payer: Self-pay

## 2018-01-10 ENCOUNTER — Emergency Department (HOSPITAL_COMMUNITY): Payer: Self-pay

## 2018-01-10 DIAGNOSIS — R519 Headache, unspecified: Secondary | ICD-10-CM

## 2018-01-10 DIAGNOSIS — R51 Headache: Secondary | ICD-10-CM | POA: Insufficient documentation

## 2018-01-10 DIAGNOSIS — Z79899 Other long term (current) drug therapy: Secondary | ICD-10-CM | POA: Insufficient documentation

## 2018-01-10 MED ORDER — KETOROLAC TROMETHAMINE 30 MG/ML IJ SOLN
30.0000 mg | Freq: Once | INTRAMUSCULAR | Status: AC
  Administered 2018-01-10: 30 mg via INTRAMUSCULAR
  Filled 2018-01-10: qty 1

## 2018-01-10 MED ORDER — METOCLOPRAMIDE HCL 10 MG PO TABS
5.0000 mg | ORAL_TABLET | Freq: Once | ORAL | Status: AC
  Administered 2018-01-10: 5 mg via ORAL
  Filled 2018-01-10: qty 1

## 2018-01-10 MED ORDER — DIPHENHYDRAMINE HCL 25 MG PO CAPS
25.0000 mg | ORAL_CAPSULE | Freq: Once | ORAL | Status: AC
  Administered 2018-01-10: 25 mg via ORAL
  Filled 2018-01-10: qty 1

## 2018-01-10 NOTE — Discharge Instructions (Addendum)
Please read attached information. If you experience any new or worsening signs or symptoms please return to the emergency room for evaluation. Please follow-up with your primary care provider or specialist as discussed.  °

## 2018-01-10 NOTE — ED Notes (Signed)
Patient transported to CT 

## 2018-01-10 NOTE — ED Notes (Signed)
Pt left prior to receiving printed discharge instructions

## 2018-01-10 NOTE — ED Provider Notes (Signed)
[facility] EMERGENCY DEPARTMENT Provider Note   CSN: [id number] Arrival date & time: 01/10/18  1243     History   Chief Complaint No chief complaint on file.   HPI Wanda Powers is a 42 y.o. female.  HPI   42 year old female presents today with complaints of headache.  Patient notes that she woke up around 5:30 AM with slowly worsening headache and right-sided neck pain.  Patient notes a history of headaches but notes this is more severe than usual.  Patient notes she had some minor dizziness and nausea associated with this.  She denies any fever, neck stiffness, trauma, neurological deficits.  Patient notes taking aspirin with minor improvement in symptoms.       Past Medical History:  Diagnosis Date  . Gestational diabetes 2013  . Headache   . Pyelonephritis 1996    Patient Active Problem List   Diagnosis Date Noted  . Chronic cholecystitis 12/25/2014  . 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 Common bile duct dilation 10/17/2014  . Biliary colic 70/35/0093  . Tension headache 09/09/2014  . Hypopigmented skin lesion 08/02/2013  . History of cesarean delivery 07/02/2012    Past Surgical History:  Procedure Laterality Date  . CESAREAN SECTION  2004   x 1  . LAPAROSCOPIC CHOLECYSTECTOMY SINGLE SITE WITH INTRAOPERATIVE CHOLANGIOGRAM N/A 12/25/2014   Procedure: LAPAROSCOPIC CHOLECYSTECTOMY SINGLE SITE WITH INTRAOPERATIVE CHOLANGIOGRAM;  Surgeon: Michael Boston, MD;  Location: [facility];  Service: General;  Laterality: N/A;     OB History    Gravida  4   Para  3   Term  3   Preterm      AB      Living  3     SAB      TAB      Ectopic      Multiple      Live Births               Home Medications    Prior to Admission medications   Medication Sig Start Date End Date Taking? Authorizing Provider  ibuprofen (ADVIL,MOTRIN) 600 MG tablet Take 600 mg by mouth every 6 (six) hours as needed.    [provider]  Multiple Vitamin (MULTIVITAMIN WITH MINERALS) TABS tablet Take 1 tablet by mouth every morning.     [provider]  naproxen (NAPROSYN) 500 MG tablet Take 1 tablet (500 mg total) by mouth 2 (two) times daily with a meal. 12/25/14   Michael Boston, MD  nortriptyline (PAMELOR) 25 MG capsule Take 1 capsule (25 mg total) by mouth at bedtime. Patient not taking: Reported on 10/13/2014 09/09/14   Willeen Niece, MD  ondansetron (ZOFRAN) 4 MG tablet Take 1 tablet (4 mg total) by mouth every 6 (six) hours as needed for nausea or vomiting. Patient not taking: Reported on 03/04/2992 01/31/95   Delora Fuel, MD  oxyCODONE (OXY IR/ROXICODONE) 5 MG immediate release tablet Take 1-2 tablets (5-10 mg total) by mouth every 4 (four) hours as needed for moderate pain, severe pain or breakthrough pain. 12/25/14   Michael Boston, MD  oxyCODONE-acetaminophen (PERCOCET) 5-325 MG per tablet Take 1 tablet by mouth every 4 (four) hours as needed for moderate pain. Patient not taking: Reported on 7/89/3810 1/75/10   Delora Fuel, MD    Family History Family History  Problem Relation Age of Onset  . Hyperlipidemia Mother   . Hypertension Mother   . Cancer Mother   . Heart attack  Neg Hx   . Diabetes Neg Hx   . Sudden death Neg Hx     Social History Social History   Tobacco Use  . Smoking status: Never Smoker  . Smokeless tobacco: Never Used  Substance Use Topics  . Alcohol use: No  . Drug use: No     Allergies   Patient has no known allergies.   Review of Systems Review of Systems  All other systems reviewed and are negative.    Physical Exam Updated Vital Signs BP 106/72 (BP Location: Left Arm)   Pulse 65   Temp 97.7 F (36.5 C) (Oral)   Resp 18   SpO2 97%   Physical Exam  Constitutional: She is oriented to person, place, and time. She appears well-developed and well-nourished. No distress.  HENT:  Head: Normocephalic and atraumatic.  Eyes: Pupils are equal, round, and  reactive to light. Conjunctivae and EOM are normal. Right eye exhibits no discharge. Left eye exhibits no discharge. No scleral icterus.  Neck: Normal range of motion. Neck supple. No JVD present. No tracheal deviation present.  Pulmonary/Chest: Effort normal. No stridor.  Musculoskeletal: Normal range of motion. She exhibits no edema or tenderness.  Lymphadenopathy:    She has no cervical adenopathy.  Neurological: She is alert and oriented to person, place, and time. She has normal strength. She displays no atrophy and no tremor. No cranial nerve deficit or sensory deficit. She exhibits normal muscle tone. She displays a negative Romberg sign. She displays no seizure activity. Coordination and gait normal. GCS eye subscore is 4. GCS verbal subscore is 5. GCS motor subscore is 6.  Skin: She is not diaphoretic.  Psychiatric: She has a normal mood and affect. Her behavior is normal. Judgment and thought content normal.  Nursing note and vitals reviewed.    ED Treatments / Results  Labs (all labs ordered are listed, but only abnormal results are displayed) Labs Reviewed - No data to display  EKG None  Radiology Ct Head Wo Contrast  Result Date: 01/10/2018 CLINICAL DATA:  Right-sided headache EXAM: CT HEAD WITHOUT CONTRAST TECHNIQUE: Contiguous axial images were obtained from the base of the skull through the vertex without intravenous contrast. COMPARISON:  None. FINDINGS: Brain: No evidence of acute infarction, hemorrhage, hydrocephalus, extra-axial collection or mass lesion/mass effect. Vascular: No hyperdense vessel or unexpected calcification. Skull: Normal. Negative for fracture or focal lesion. Sinuses/Orbits: No acute finding. Other: None. IMPRESSION: No acute abnormality noted. Electronically Signed   By: Inez Catalina M.D.   On: 01/10/2018 15:03    Procedures Procedures (including critical care time)  Medications Ordered in ED Medications  ketorolac (TORADOL) 30 MG/ML injection  30 mg (30 mg Intramuscular Given 01/10/18 1508)  metoCLOPramide (REGLAN) tablet 5 mg (5 mg Oral Given 01/10/18 1509)  diphenhydrAMINE (BENADRYL) capsule 25 mg (25 mg Oral Given 01/10/18 1509)     Initial Impression / Assessment and Plan / ED Course  I have reviewed the triage vital signs and the nursing notes.  Pertinent labs & imaging results that were available during my care of the patient were reviewed by me and considered in my medical decision making (see chart for details).     Labs:   Imaging: CT head without  Consults:  Therapeutics: Toradol Reglan, Benadryl  Discharge Meds:   Assessment/Plan: 42 year old female presents today with headache.  Patient has no significant complicating features here.  Her headache was dramatically improved with only minimal symptoms, no persistent dizziness, no neurological deficits, negative  head CT.  Low suspicion for infection intracranial abnormality, or any other life-threatening etiology.  Patient discharged with strict return precautions follow-up information.  She verbalized understanding and agreement to today's plan had no further questions or concerns the time discharge.      Final Clinical Impressions(s) / ED Diagnoses   Final diagnoses:  Acute nonintractable headache, unspecified headache type    ED Discharge Orders    None       Okey Regal, PA-C 01/10/18 [place], MD 01/11/18 1714

## 2018-01-10 NOTE — ED Triage Notes (Signed)
Patient complains of right sided headache since 530am with nausea and dizziness, stats that she has headaches but normally not this severe. Alert and oriented, denies trauma. No neuro deficits, no weakness

## 2018-02-28 ENCOUNTER — Ambulatory Visit (INDEPENDENT_AMBULATORY_CARE_PROVIDER_SITE_OTHER): Payer: Self-pay | Admitting: Family Medicine

## 2018-02-28 ENCOUNTER — Other Ambulatory Visit: Payer: Self-pay

## 2018-02-28 VITALS — BP 100/60 | HR 66 | Temp 98.5°F | Ht 64.0 in | Wt 155.4 lb

## 2018-02-28 DIAGNOSIS — L299 Pruritus, unspecified: Secondary | ICD-10-CM

## 2018-02-28 MED ORDER — HYDROXYZINE HCL 10 MG PO TABS: 10.0000 mg | ORAL_TABLET | Freq: Three times a day (TID) | ORAL | 0 refills | Status: DC | PRN

## 2018-02-28 MED ORDER — HYDROCORTISONE 1 % EX LOTN: 1.0000 "application " | TOPICAL_LOTION | Freq: Two times a day (BID) | CUTANEOUS | 0 refills | Status: DC

## 2018-02-28 MED ORDER — CAPSAICIN-MENTHOL-METHYL SAL 0.025-1-12 % EX CREA: 1.0000 | TOPICAL_CREAM | Freq: Three times a day (TID) | CUTANEOUS | 1 refills | Status: DC | PRN

## 2018-02-28 NOTE — Patient Instructions (Addendum)
It was great to meet you today! Thank you for letting me participate in your care!  Today, we discussed improved stomach pain. It was likely due to a viral illness and will resolve on its own. If it gets worse please return to the clinic to seek care.  For your itching at night please take Hydroxyzine that I prescribed for you 1-2 hours before bedtime if you are itching. It can make you sleepy so be careful if you take it during the day and don't drive after taking it. You can use the Capsaicin cream for local areas of itching during the day.  Please return to the clinic if you continue itching or have pain or if it gets worse.  Be well, Harolyn Rutherford, DO PGY-2, [facility]

## 2018-02-28 NOTE — Progress Notes (Signed)
Subjective: Chief Complaint  Patient presents with  . upset stomach    been to [place]     HPI: Wanda Powers is a 42 y.o. presenting to clinic today to discuss the following:  Upset Stomach Resolved. Patient states she still has some lingering nausea after meals but it is much better. No vomiting, no diarrhea, no fevers, and no more abdominal pain after eating or in between meals. Patient was recently in [place] for several weeks with possible exposure to contaminated foods/water.  Puritis Started 10 days ago with intense itching especially at night. No rash but her arms and legs show areas where she has been scratching but only mild skin abrasion. No erythema, swelling, or pain. She has never had this before. No other symptoms associated with the itching and no new medications.   Health Maintenance: None     ROS noted in HPI.   Past Medical, Surgical, Social, and Family History Reviewed & Updated per EMR.   Pertinent Historical Findings include:   Social History   Tobacco Use  Smoking Status Never Smoker  Smokeless Tobacco Never Used    Objective: BP 100/60   Pulse 66   Temp 98.5 F (36.9 C)   Ht 5\' 4"  (1.626 m)   Wt 155 lb 6.4 oz (70.5 kg)   LMP 02/14/2018   SpO2 98%   BMI 26.67 kg/m  Vitals and nursing notes reviewed  Physical Exam Gen: Alert and Oriented x 3, NAD HEENT: Normocephalic, atraumatic, PERRLA, EOMI CV: RRR, no murmurs, normal S1, S2 split, +2 pulses dorsalis pedis bilaterally Resp: CTAB, no wheezing, rales, or rhonchi, comfortable work of breathing Abd: non-distended, non-tender, soft, +bs in all four quadrants Ext: no clubbing, cyanosis, or edema Skin: warm, dry, intact, focal areas of minor skin abrasions diffusely on the arms and legs bilaterally, no maculopapular rash  No results found for this or any previous visit (from the past 72 hour(s)).  Assessment/Plan:  Pruritus Patient endorsing pruritus with no known  exposure. History significant for recent travel to [place] and recent likely viral gastrointestinal illness. Hydroxyzine at night with Capsaicin cream to use during the day for areas of local itching. Patient instructed to return if not improved or worsening at two weeks.  Abdominal Pain Resolved. Most likely due to an acute viral gastrointestinal infection. Patient counseled that if she does not improve in 7-10 days to return to clinic and seek care. However, given her pain is already improved and her vomiting has ceased with only intermittent mild nausea remaining I find it unlikely further workup will be necessary.  PATIENT EDUCATION PROVIDED: See AVS    Diagnosis and plan along with any newly prescribed medication(s) were discussed in detail with this patient today. The patient verbalized understanding and agreed with the plan. Patient advised if symptoms worsen return to clinic or ER.   Health Maintainance:   No orders of the defined types were placed in this encounter.   Meds ordered this encounter  Medications  . DISCONTD: hydrocortisone 1 % lotion    Sig: Apply 1 application topically 2 (two) times daily.    Dispense:  118 mL    Refill:  0  . hydrOXYzine (ATARAX/VISTARIL) 10 MG tablet    Sig: Take 1 tablet (10 mg total) by mouth 3 (three) times daily as needed for itching.    Dispense:  30 tablet    Refill:  0  . Capsaicin-Menthol-Methyl Sal (CAPSAICIN-METHYL SAL-MENTHOL) 0.025-1-12 % CREA  Sig: Apply 1 Tube topically 3 (three) times daily as needed (Use as needed for itching).    Dispense:  56.6 g    Refill:  Raymond, DO 02/28/2018, 9:26 AM PGY-2 [place] [facility]

## 2018-02-28 NOTE — Assessment & Plan Note (Signed)
Patient endorsing pruritus with no known exposure. History significant for recent travel to [place] and recent likely viral gastrointestinal illness. Hydroxyzine at night with Capsaicin cream to use during the day for areas of local itching. Patient instructed to return if not improved or worsening at two weeks.

## 2018-03-27 ENCOUNTER — Ambulatory Visit (HOSPITAL_COMMUNITY)
Admission: EM | Admit: 2018-03-27 | Discharge: 2018-03-27 | Disposition: A | Discharge status: Home / Self Care | Payer: No Typology Code available for payment source | Attending: Family Medicine | Admitting: Family Medicine

## 2018-03-27 ENCOUNTER — Encounter (HOSPITAL_COMMUNITY): Payer: Self-pay | Admitting: Emergency Medicine

## 2018-03-27 DIAGNOSIS — B9789 Other viral agents as the cause of diseases classified elsewhere: Secondary | ICD-10-CM

## 2018-03-27 DIAGNOSIS — J069 Acute upper respiratory infection, unspecified: Secondary | ICD-10-CM

## 2018-03-27 MED ORDER — FLUTICASONE PROPIONATE 50 MCG/ACT NA SUSP: 1.0000 | Freq: Every day | NASAL | 2 refills | Status: DC

## 2018-03-27 MED ORDER — CETIRIZINE-PSEUDOEPHEDRINE ER 5-120 MG PO TB12: 1.0000 | ORAL_TABLET | Freq: Every day | ORAL | 0 refills | Status: DC

## 2018-03-27 NOTE — Discharge Instructions (Addendum)
It was nice meeting you!!  I believe you have a viral upper respiratory infection. Symptomatic treatment. Medicine sent to the pharmacy to help. Follow up as needed for continued or worsening symptoms

## 2018-03-27 NOTE — ED Triage Notes (Signed)
Pt sts URI sx with cough and congestion 

## 2018-03-27 NOTE — ED Provider Notes (Signed)
[facility]    CSN: [id number] Arrival date & time: 03/27/18  1302     History   Chief Complaint Chief Complaint  Patient presents with  . URI    HPI Wanda Powers is a 42 y.o. female.   Patient is a 42 year old female presents with URI symptoms x1 week.  She has had slight cough with congestion, rhinorrhea, bilateral ear pain and sore throat.  The symptoms have been constant but not worsening.  She took Aleve one time for her symptoms.  She denies any fever, chills, body aches, fatigue.  No recent sick contacts.  No rashes or insect bites.  Denies any chest pain, shortness of breath.  ROS per HPI      Past Medical History:  Diagnosis Date  . Gestational diabetes 2013  . Headache   . Pyelonephritis 1996    Patient Active Problem List   Diagnosis Date Noted  . Pruritus 02/28/2018  . Chronic cholecystitis 12/25/2014  . 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 Common bile duct dilation 10/17/2014  . Biliary colic 27/12/2374  . Tension headache 09/09/2014  . Hypopigmented skin lesion 08/02/2013  . History of cesarean delivery 07/02/2012    Past Surgical History:  Procedure Laterality Date  . CESAREAN SECTION  2004   x 1  . LAPAROSCOPIC CHOLECYSTECTOMY SINGLE SITE WITH INTRAOPERATIVE CHOLANGIOGRAM N/A 12/25/2014   Procedure: LAPAROSCOPIC CHOLECYSTECTOMY SINGLE SITE WITH INTRAOPERATIVE CHOLANGIOGRAM;  Surgeon: Michael Boston, MD;  Location: [facility];  Service: General;  Laterality: N/A;    OB History    Gravida  4   Para  3   Term  3   Preterm      AB      Living  3     SAB      TAB      Ectopic      Multiple      Live Births               Home Medications    Prior to Admission medications   Medication Sig Start Date End Date Taking? Authorizing Provider  Capsaicin-Menthol-Methyl Sal (CAPSAICIN-METHYL SAL-MENTHOL) 0.025-1-12 % CREA Apply 1 Tube topically 3 (three) times daily as needed (Use as needed for  itching). 02/28/18   Nuala Alpha, DO  cetirizine-pseudoephedrine (ZYRTEC-D) 5-120 MG tablet Take 1 tablet by mouth daily. 03/27/18   Katricia Prehn, Tressia Miners A, NP  fluticasone (FLONASE) 50 MCG/ACT nasal spray Place 1 spray into both nostrils daily. 03/27/18   Loura Halt A, NP  hydrOXYzine (ATARAX/VISTARIL) 10 MG tablet Take 1 tablet (10 mg total) by mouth 3 (three) times daily as needed for itching. 02/28/18   Nuala Alpha, DO  Multiple Vitamin (MULTIVITAMIN WITH MINERALS) TABS tablet Take 1 tablet by mouth every morning.     [provider]    Family History Family History  Problem Relation Age of Onset  . Hyperlipidemia Mother   . Hypertension Mother   . Cancer Mother   . Heart attack Neg Hx   . Diabetes Neg Hx   . Sudden death Neg Hx     Social History Social History   Tobacco Use  . Smoking status: Never Smoker  . Smokeless tobacco: Never Used  Substance Use Topics  . Alcohol use: No  . Drug use: No     Allergies   Patient has no known allergies.   Review of Systems Review of Systems   Physical Exam Triage Vital Signs ED Triage Vitals [03/27/18 1420]  Enc Vitals Group     BP 116/77     Pulse Rate 74     Resp 18     Temp 98.2 F (36.8 C)     Temp Source Oral     SpO2 99 %     Weight      Height      Head Circumference      Peak Flow      Pain Score      Pain Loc      Pain Edu?      Excl. in [place]?    No data found.  Updated Vital Signs BP 116/77 (BP Location: Right Arm)   Pulse 74   Temp 98.2 F (36.8 C) (Oral)   Resp 18   SpO2 99%   Visual Acuity Right Eye Distance:   Left Eye Distance:   Bilateral Distance:    Right Eye Near:   Left Eye Near:    Bilateral Near:     Physical Exam  Constitutional: She is oriented to person, place, and time. She appears well-developed and well-nourished.  HENT:  Head: Normocephalic and atraumatic.  Bilateral TMs normal.  External ears normal.  Without posterior oropharyngeal erythema, tonsillar  swelling or exudates. No lesions. Drainage noted.   Moderate nasal turbinate swelling.  No lymphadenopathy.     Eyes: Conjunctivae are normal.  Neck: Normal range of motion.  Pulmonary/Chest: Effort normal and breath sounds normal.  Lungs clear in all fields. No dyspnea or distress. No retractions or nasal flaring.     Musculoskeletal: Normal range of motion.  Lymphadenopathy:    She has no cervical adenopathy.  Neurological: She is alert and oriented to person, place, and time.  Skin: Skin is warm and dry.  Psychiatric: She has a normal mood and affect.  Nursing note and vitals reviewed.    UC Treatments / Results  Labs (all labs ordered are listed, but only abnormal results are displayed) Labs Reviewed - No data to display  EKG None  Radiology No results found.  Procedures Procedures (including critical care time)  Medications Ordered in UC Medications - No data to display  Initial Impression / Assessment and Plan / UC Course  I have reviewed the triage vital signs and the nursing notes.  Pertinent labs & imaging results that were available during my care of the patient were reviewed by me and considered in my medical decision making (see chart for details).     Viral URI-symptom medic treatment Zyrtec and Flonase Follow up as needed for continued or worsening symptoms  Final Clinical Impressions(s) / UC Diagnoses   Final diagnoses:  Viral URI with cough     Discharge Instructions     It was nice meeting you!!  I believe you have a viral upper respiratory infection. Symptomatic treatment. Medicine sent to the pharmacy to help. Follow up as needed for continued or worsening symptoms    ED Prescriptions    Medication Sig Dispense Auth. Provider   fluticasone (FLONASE) 50 MCG/ACT nasal spray Place 1 spray into both nostrils daily. 16 g Merian Wroe A, NP   cetirizine-pseudoephedrine (ZYRTEC-D) 5-120 MG tablet Take 1 tablet by mouth daily. 30 tablet  Loura Halt A, NP     Controlled Substance Prescriptions [place] Controlled Substance Registry consulted? Not Applicable   Orvan July, NP 03/27/18 1453

## 2018-09-16 ENCOUNTER — Other Ambulatory Visit: Payer: Self-pay

## 2018-09-16 ENCOUNTER — Emergency Department (HOSPITAL_COMMUNITY)
Admission: EM | Admit: 2018-09-16 | Discharge: 2018-09-16 | Disposition: A | Discharge status: Home / Self Care | Payer: No Typology Code available for payment source | Attending: Emergency Medicine | Admitting: Emergency Medicine

## 2018-09-16 ENCOUNTER — Encounter (HOSPITAL_COMMUNITY): Payer: Self-pay | Admitting: Emergency Medicine

## 2018-09-16 DIAGNOSIS — R69 Illness, unspecified: Secondary | ICD-10-CM

## 2018-09-16 DIAGNOSIS — Z79899 Other long term (current) drug therapy: Secondary | ICD-10-CM | POA: Insufficient documentation

## 2018-09-16 DIAGNOSIS — J111 Influenza due to unidentified influenza virus with other respiratory manifestations: Secondary | ICD-10-CM

## 2018-09-16 NOTE — Discharge Instructions (Addendum)
Home to rest.  Drink plenty of hydrating fluids such as Gatorade.  Take Motrin and Tylenol as needed as directed for body aches and fever. Take Delsym as directed for cough.

## 2018-09-16 NOTE — ED Triage Notes (Signed)
Pt in with c/o generalized body aches, weakness and flu-symptoms x 2 days. States she vomitted x 2 last night.

## 2018-09-16 NOTE — ED Provider Notes (Signed)
[facility] DEPART[facility] Provider Note   CSN: [id number] Arrival date & time: 09/16/18  0930    History   Chief Complaint Chief Complaint  Patient presents with  . Generalized Body Aches  . flu-like symptoms  . Headache    HPI Wanda Powers is a 43 y.o. female.     43 year old female presents with complaint of cough, congestion, body aches, headache and posttussive emesis x2 episodes.  Patient reports fever with max temp of 100 yesterday, last took Tylenol yesterday. Patient's son is sick with similar symptoms (84yo). Patient is a non smoker, denies history of asthma or chronic lung disease. No other complaints or concerns.      Past Medical History:  Diagnosis Date  . Gestational diabetes 2013  . Headache   . Pyelonephritis 1996    Patient Active Problem List   Diagnosis Date Noted  . Pruritus 02/28/2018  . Chronic cholecystitis 12/25/2014  . 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 Common bile duct dilation 10/17/2014  . Biliary colic 74/02/1447  . Tension headache 09/09/2014  . Hypopigmented skin lesion 08/02/2013  . History of cesarean delivery 07/02/2012    Past Surgical History:  Procedure Laterality Date  . CESAREAN SECTION  2004   x 1  . LAPAROSCOPIC CHOLECYSTECTOMY SINGLE SITE WITH INTRAOPERATIVE CHOLANGIOGRAM N/A 12/25/2014   Procedure: LAPAROSCOPIC CHOLECYSTECTOMY SINGLE SITE WITH INTRAOPERATIVE CHOLANGIOGRAM;  Surgeon: Michael Boston, MD;  Location: [facility];  Service: General;  Laterality: N/A;     OB History    Gravida  4   Para  3   Term  3   Preterm      AB      Living  3     SAB      TAB      Ectopic      Multiple      Live Births               Home Medications    Prior to Admission medications   Medication Sig Start Date End Date Taking? Authorizing Provider  Capsaicin-Menthol-Methyl Sal (CAPSAICIN-METHYL SAL-MENTHOL) 0.025-1-12 % CREA Apply 1 Tube topically 3 (three) times  daily as needed (Use as needed for itching). 02/28/18   Nuala Alpha, DO  cetirizine-pseudoephedrine (ZYRTEC-D) 5-120 MG tablet Take 1 tablet by mouth daily. 03/27/18   Bast, Tressia Miners A, NP  fluticasone (FLONASE) 50 MCG/ACT nasal spray Place 1 spray into both nostrils daily. 03/27/18   Loura Halt A, NP  hydrOXYzine (ATARAX/VISTARIL) 10 MG tablet Take 1 tablet (10 mg total) by mouth 3 (three) times daily as needed for itching. 02/28/18   Nuala Alpha, DO  Multiple Vitamin (MULTIVITAMIN WITH MINERALS) TABS tablet Take 1 tablet by mouth every morning.     [provider]    Family History Family History  Problem Relation Age of Onset  . Hyperlipidemia Mother   . Hypertension Mother   . Cancer Mother   . Heart attack Neg Hx   . Diabetes Neg Hx   . Sudden death Neg Hx     Social History Social History   Tobacco Use  . Smoking status: Never Smoker  . Smokeless tobacco: Never Used  Substance Use Topics  . Alcohol use: No  . Drug use: No     Allergies   Patient has no known allergies.   Review of Systems Review of Systems  Constitutional: Positive for chills and fever.  HENT: Positive for congestion. Negative for sinus pressure, sinus  pain and sneezing.   Respiratory: Positive for cough. Negative for shortness of breath and wheezing.   Cardiovascular: Negative for chest pain.  Gastrointestinal: Positive for vomiting.  Musculoskeletal: Positive for arthralgias and myalgias.  Skin: Negative for rash and wound.  Allergic/Immunologic: Negative for immunocompromised state.  Neurological: Negative for dizziness and weakness.  Hematological: Negative for adenopathy.  Psychiatric/Behavioral: Negative for confusion.  All other systems reviewed and are negative.    Physical Exam Updated Vital Signs BP 130/70 (BP Location: Right Arm)   Pulse (!) 107   Temp 99 F (37.2 C) (Oral)   Resp 20   Wt 70.3 kg   LMP 09/15/2018   SpO2 97%   BMI 26.61 kg/m   Physical  Exam Vitals signs and nursing note reviewed.  Constitutional:      General: She is not in acute distress.    Appearance: She is well-developed. She is not diaphoretic.  HENT:     Head: Normocephalic and atraumatic.     Right Ear: Tympanic membrane and ear canal normal.     Left Ear: Tympanic membrane and ear canal normal.     Nose: Congestion present.     Mouth/Throat:     Mouth: Mucous membranes are moist.     Pharynx: Posterior oropharyngeal erythema present. No oropharyngeal exudate.  Neck:     Musculoskeletal: Neck supple.  Cardiovascular:     Rate and Rhythm: Normal rate and regular rhythm.     Pulses: Normal pulses.     Heart sounds: Normal heart sounds. No friction rub.  Pulmonary:     Effort: Pulmonary effort is normal.     Breath sounds: Normal breath sounds.  Lymphadenopathy:     Cervical: No cervical adenopathy.  Skin:    General: Skin is warm and dry.     Findings: No erythema or rash.  Neurological:     Mental Status: She is alert and oriented to person, place, and time.     GCS: GCS eye subscore is 4. GCS verbal subscore is 5. GCS motor subscore is 6.  Psychiatric:        Behavior: Behavior normal.      ED Treatments / Results  Labs (all labs ordered are listed, but only abnormal results are displayed) Labs Reviewed - No data to display  EKG None  Radiology No results found.  Procedures Procedures (including critical care time)  Medications Ordered in ED Medications - No data to display   Initial Impression / Assessment and Plan / ED Course  I have reviewed the triage vital signs and the nursing notes.  Pertinent labs & imaging results that were available during my care of the patient were reviewed by me and considered in my medical decision making (see chart for details).  Clinical Course as of Sep 16 1111  Sun Sep 15, 7737  6764 43 year old otherwise healthy female presents with complaint of flulike symptoms x2 days.  Patient is afebrile  upon arrival in the emergency room and has not taken antipyretics in the last 12 hours.  Discussed likely flu, recommend Motrin, Tylenol, hydrating fluids and OTC cough syrup, given note for work as requested for her employer.   [LM]    Clinical Course User Index [LM] Tacy Learn, PA-C   Final Clinical Impressions(s) / ED Diagnoses   Final diagnoses:  Influenza-like illness    ED Discharge Orders    None       Tacy Learn, PA-C 09/16/18 1114  Sherwood Gambler, MD 09/16/18 [id number]

## 2018-09-16 NOTE — ED Notes (Signed)
ED Provider at bedside. 

## 2018-09-22 ENCOUNTER — Other Ambulatory Visit: Payer: Self-pay

## 2018-09-22 ENCOUNTER — Ambulatory Visit (HOSPITAL_COMMUNITY)
Admission: EM | Admit: 2018-09-22 | Discharge: 2018-09-22 | Disposition: A | Discharge status: Home / Self Care | Payer: No Typology Code available for payment source | Attending: Radiology | Admitting: Radiology

## 2018-09-22 ENCOUNTER — Encounter (HOSPITAL_COMMUNITY): Payer: Self-pay | Admitting: *Deleted

## 2018-09-22 DIAGNOSIS — J069 Acute upper respiratory infection, unspecified: Secondary | ICD-10-CM

## 2018-09-22 MED ORDER — PREDNISONE 10 MG PO TABS: 40.0000 mg | ORAL_TABLET | Freq: Every day | ORAL | 0 refills | Status: AC

## 2018-09-22 MED ORDER — DEXAMETHASONE SODIUM PHOSPHATE 10 MG/ML IJ SOLN
10.0000 mg | Freq: Once | INTRAMUSCULAR | Status: AC
  Administered 2018-09-22: 10 mg via INTRAMUSCULAR

## 2018-09-22 MED ORDER — DEXAMETHASONE SODIUM PHOSPHATE 10 MG/ML IJ SOLN
INTRAMUSCULAR | Status: AC
  Filled 2018-09-22: qty 1

## 2018-09-22 MED ORDER — CETIRIZINE-PSEUDOEPHEDRINE ER 5-120 MG PO TB12: 1.0000 | ORAL_TABLET | Freq: Every day | ORAL | 0 refills | Status: DC

## 2018-09-22 MED ORDER — BENZONATATE 100 MG PO CAPS: 100.0000 mg | ORAL_CAPSULE | Freq: Three times a day (TID) | ORAL | 0 refills | Status: DC

## 2018-09-22 NOTE — ED Triage Notes (Signed)
C/O feeling feverish, cough, weakness, body aches, HA x 8 days.

## 2018-09-22 NOTE — ED Notes (Signed)
Pt states continues to feel better.  Nausea and lightheadedness resolved.  Ambulating without difficulty.

## 2018-09-22 NOTE — Discharge Instructions (Addendum)
Continue to push fluids and take over the counter medications as directed on the back of the box for symptomatic relief.  ° °

## 2018-09-22 NOTE — ED Notes (Signed)
Pt feeling lightheaded and w/ nausea following injection.  Cool cloths provided, pt laying down with RN at bedside with improvement of sxs.

## 2018-09-22 NOTE — ED Notes (Signed)
Pt states she is feeling better.  Resting with sips soda with imrovement.

## 2018-09-22 NOTE — ED Provider Notes (Signed)
[facility]    CSN: [id number] Arrival date & time: 09/22/18  1117     History   Chief Complaint Chief Complaint  Patient presents with  . Cough  . Fever    HPI Wanda Powers is a 43 y.o. female.   43 year old female presents with generalized muscle aches and pains, headache fever chills malaise and a nonproductive cough x8 days.  Patient states that she previously had a fever that has self resolved.  Patient states that her sister has tested positive for influenza.  Condition is acute in nature.  Condition is made better by nothing.  Condition is made worse by nothing.  Patient denies any improvement condition from Tylenol taken prior to arrival this facility.  Patient states that she was seen in the emergency room on March 1 and prescribed Tylenol that time.     Past Medical History:  Diagnosis Date  . Gestational diabetes 2013  . Headache   . Pyelonephritis 1996    Patient Active Problem List   Diagnosis Date Noted  . Pruritus 02/28/2018  . Chronic cholecystitis 12/25/2014  . 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 Common bile duct dilation 10/17/2014  . Biliary colic 29/93/7169  . Tension headache 09/09/2014  . Hypopigmented skin lesion 08/02/2013  . History of cesarean delivery 07/02/2012    Past Surgical History:  Procedure Laterality Date  . CESAREAN SECTION  2004   x 1  . CHOLECYSTECTOMY    . LAPAROSCOPIC CHOLECYSTECTOMY SINGLE SITE WITH INTRAOPERATIVE CHOLANGIOGRAM N/A 12/25/2014   Procedure: LAPAROSCOPIC CHOLECYSTECTOMY SINGLE SITE WITH INTRAOPERATIVE CHOLANGIOGRAM;  Surgeon: Michael Boston, MD;  Location: [facility];  Service: General;  Laterality: N/A;    OB History    Gravida  4   Para  3   Term  3   Preterm      AB      Living  3     SAB      TAB      Ectopic      Multiple      Live Births               Home Medications    Prior to Admission medications   Medication Sig Start Date End Date Taking?  Authorizing Provider  benzonatate (TESSALON) 100 MG capsule Take 1 capsule (100 mg total) by mouth every 8 (eight) hours. 09/22/18   Jacqualine Mau, NP  Capsaicin-Menthol-Methyl Sal (CAPSAICIN-METHYL SAL-MENTHOL) 0.025-1-12 % CREA Apply 1 Tube topically 3 (three) times daily as needed (Use as needed for itching). 02/28/18   Nuala Alpha, DO  cetirizine-pseudoephedrine (ZYRTEC-D) 5-120 MG tablet Take 1 tablet by mouth daily. 09/22/18   Jacqualine Mau, NP  fluticasone (FLONASE) 50 MCG/ACT nasal spray Place 1 spray into both nostrils daily. 03/27/18   Loura Halt A, NP  hydrOXYzine (ATARAX/VISTARIL) 10 MG tablet Take 1 tablet (10 mg total) by mouth 3 (three) times daily as needed for itching. 02/28/18   Nuala Alpha, DO  Multiple Vitamin (MULTIVITAMIN WITH MINERALS) TABS tablet Take 1 tablet by mouth every morning.     [provider]  predniSONE (DELTASONE) 10 MG tablet Take 4 tablets (40 mg total) by mouth daily for 5 days. 09/22/18 09/27/18  Jacqualine Mau, NP    Family History Family History  Problem Relation Age of Onset  . Hyperlipidemia Mother   . Hypertension Mother   . Cancer Mother   . Heart attack Neg Hx   . Diabetes Neg Hx   .  Sudden death Neg Hx     Social History Social History   Tobacco Use  . Smoking status: Never Smoker  . Smokeless tobacco: Never Used  Substance Use Topics  . Alcohol use: No  . Drug use: No     Allergies   Patient has no known allergies.   Review of Systems Review of Systems  Constitutional: Positive for fatigue and fever.  Respiratory: Positive for cough.   Musculoskeletal: Positive for arthralgias and myalgias.  Neurological: Positive for headaches.     Physical Exam Triage Vital Signs ED Triage Vitals [09/22/18 1219]  Enc Vitals Group     BP 125/81     Pulse Rate 81     Resp 16     Temp 98.5 F (36.9 C)     Temp Source Oral     SpO2 98 %     Weight      Height      Head Circumference      Peak  Flow      Pain Score 10     Pain Loc      Pain Edu?      Excl. in [place]?    No data found.  Updated Vital Signs BP 125/81   Pulse 81   Temp 98.5 F (36.9 C) (Oral)   Resp 16   LMP 09/15/2018   SpO2 98%   Visual Acuity Right Eye Distance:   Left Eye Distance:   Bilateral Distance:    Right Eye Near:   Left Eye Near:    Bilateral Near:     Physical Exam Vitals signs and nursing note reviewed.  Constitutional:      Appearance: She is well-developed.  HENT:     Head: Normocephalic and atraumatic.  Eyes:     Conjunctiva/sclera: Conjunctivae normal.  Neck:     Musculoskeletal: Normal range of motion.  Cardiovascular:     Rate and Rhythm: Normal rate and regular rhythm.  Pulmonary:     Effort: Pulmonary effort is normal.     Breath sounds: Normal breath sounds.  Musculoskeletal: Normal range of motion.  Skin:    General: Skin is warm.  Neurological:     Mental Status: She is alert and oriented to person, place, and time.      UC Treatments / Results  Labs (all labs ordered are listed, but only abnormal results are displayed) Labs Reviewed - No data to display  EKG None  Radiology No results found.  Procedures Procedures (including critical care time)  Medications Ordered in UC Medications  dexamethasone (DECADRON) injection 10 mg (has no administration in time range)    Initial Impression / Assessment and Plan / UC Course  I have reviewed the triage vital signs and the nursing notes.  Pertinent labs & imaging results that were available during my care of the patient were reviewed by me and considered in my medical decision making (see chart for details).      Final Clinical Impressions(s) / UC Diagnoses   Final diagnoses:  None   Discharge Instructions   None    ED Prescriptions    Medication Sig Dispense Auth. Provider   cetirizine-pseudoephedrine (ZYRTEC-D) 5-120 MG tablet Take 1 tablet by mouth daily. 30 tablet Rushie Nyhan C, NP    benzonatate (TESSALON) 100 MG capsule Take 1 capsule (100 mg total) by mouth every 8 (eight) hours. 21 capsule Rushie Nyhan C, NP   predniSONE (DELTASONE) 10 MG tablet Take 4 tablets (40 mg  total) by mouth daily for 5 days. 20 tablet Jacqualine Mau, NP     Controlled Substance Prescriptions [place] Controlled Substance Registry consulted? Not Applicable   Jacqualine Mau, NP 09/22/18 1303

## 2019-11-22 ENCOUNTER — Ambulatory Visit (INDEPENDENT_AMBULATORY_CARE_PROVIDER_SITE_OTHER): Payer: 59 | Admitting: Sports Medicine

## 2019-11-22 ENCOUNTER — Other Ambulatory Visit: Payer: Self-pay

## 2019-11-22 ENCOUNTER — Encounter: Payer: Self-pay | Admitting: Sports Medicine

## 2019-11-22 DIAGNOSIS — L600 Ingrowing nail: Secondary | ICD-10-CM | POA: Diagnosis not present

## 2019-11-22 DIAGNOSIS — M79675 Pain in left toe(s): Secondary | ICD-10-CM | POA: Diagnosis not present

## 2019-11-22 MED ORDER — NEOMYCIN-POLYMYXIN-HC 3.5-10000-1 OT SOLN: OTIC | 0 refills | Status: DC

## 2019-11-22 NOTE — Progress Notes (Signed)
Subjective: Wanda Powers is a 44 y.o. female patient presents to office today complaining of a moderately painful incurvated, red, hot, swollen first toenail on the left foot at the medial and lateral borders. This has been present for few months. Patient has treated this by trimming. Patient denies fever/chills/nausea/vomitting/any other related constitutional symptoms at this time.  Interpreter present during this visit  Review of Systems  All other systems reviewed and are negative.   Patient Active Problem List   Diagnosis Date Noted  . Pruritus 02/28/2018  . Chronic cholecystitis 12/25/2014  . 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 Common bile duct dilation 10/17/2014  . Biliary colic [id number]  . Tension headache 09/09/2014  . Hypopigmented skin lesion 08/02/2013  . History of cesarean delivery 07/02/2012    No current outpatient medications on file prior to visit.   No current facility-administered medications on file prior to visit.    No Known Allergies  Objective:  There were no vitals filed for this visit.  General: Well developed, nourished, in no acute distress, alert and oriented x3   Dermatology: Skin is warm, dry and supple bilateral.  Left hallux nail appears to be moderately incurvated with hyperkeratosis formation at the distal aspects of  the medial and lateral nail borders. (-) Erythema. (+) Edema. (-) serosanguous  drainage present. The remaining nails appear unremarkable at this time. There are no open sores, lesions or other signs of infection  present.  Vascular: Dorsalis Pedis artery and Posterior Tibial artery pedal pulses are 2/4 bilateral with immedate capillary fill time. Pedal hair growth present. No lower extremity edema.   Neruologic: Grossly intact via light touch bilateral.  Musculoskeletal: Tenderness to palpation of the left hallux medial and lateral nail fold(s). Muscular strength within normal limits in all groups  bilateral.   Assesement and Plan: Problem List Items Addressed This Visit    None    Visit Diagnoses    Ingrown toenail of left foot    -  Primary   Toe pain, left          -Discussed treatment alternatives and plan of care; Explained permanent/temporary nail avulsion and post procedure course to patient. Patient elects for PNA left hallux medial and lateral - After a verbal and written consent, injected 3 ml of a 50:50 mixture of 2% plain  lidocaine and 0.5% plain marcaine in a normal hallux block fashion. Next, a  betadine prep was performed. Anesthesia was tested and found to be appropriate.  The offending left hallux medial and lateral nail border was then incised from the hyponychium to the epinychium. The offending nail border was removed and cleared from the field. The area was curretted for any remaining nail or spicules. Phenol application performed and the area was then flushed with alcohol and dressed with antibiotic cream and a dry sterile dressing. -Patient was instructed to leave the dressing intact for today and begin soaking  in a weak solution of betadine or Epsom salt and water tomorrow. Patient was instructed to  soak for 15-20 minutes each day and apply neosporin/corticosporin and a gauze or bandaid dressing each day. -Patient was instructed to monitor the toe for signs of infection and return to office if toe becomes red, hot or swollen. -Advised ice, elevation, and tylenol or motrin if needed for pain.  -Patient is to return in 2 weeks for follow up care/nail check or sooner if problems arise.  Landis Martins, DPM

## 2019-11-22 NOTE — Patient Instructions (Signed)

## 2019-12-06 ENCOUNTER — Ambulatory Visit: Payer: 59 | Admitting: Sports Medicine

## 2020-01-03 ENCOUNTER — Other Ambulatory Visit: Payer: Self-pay | Admitting: Obstetrics and Gynecology

## 2020-01-03 DIAGNOSIS — R928 Other abnormal and inconclusive findings on diagnostic imaging of breast: Secondary | ICD-10-CM

## 2020-01-24 ENCOUNTER — Other Ambulatory Visit: Payer: No Typology Code available for payment source

## 2020-02-05 ENCOUNTER — Other Ambulatory Visit: Payer: No Typology Code available for payment source

## 2020-07-01 ENCOUNTER — Ambulatory Visit: Payer: 59 | Admitting: Surgery

## 2020-07-27 ENCOUNTER — Encounter: Payer: Self-pay | Admitting: Surgery

## 2020-07-27 ENCOUNTER — Ambulatory Visit (INDEPENDENT_AMBULATORY_CARE_PROVIDER_SITE_OTHER): Payer: 59 | Admitting: Surgery

## 2020-07-27 ENCOUNTER — Other Ambulatory Visit: Payer: Self-pay

## 2020-07-27 VITALS — BP 119/79 | HR 58 | Temp 98.7°F | Ht 64.0 in | Wt 158.0 lb

## 2020-07-27 DIAGNOSIS — E559 Vitamin D deficiency, unspecified: Secondary | ICD-10-CM | POA: Insufficient documentation

## 2020-07-27 DIAGNOSIS — K449 Diaphragmatic hernia without obstruction or gangrene: Secondary | ICD-10-CM

## 2020-07-27 DIAGNOSIS — E782 Mixed hyperlipidemia: Secondary | ICD-10-CM | POA: Insufficient documentation

## 2020-07-27 DIAGNOSIS — J302 Other seasonal allergic rhinitis: Secondary | ICD-10-CM | POA: Insufficient documentation

## 2020-07-27 DIAGNOSIS — R209 Unspecified disturbances of skin sensation: Secondary | ICD-10-CM | POA: Insufficient documentation

## 2020-07-27 NOTE — Patient Instructions (Addendum)
CT Chest/Abdomen scheduled 08/10/2020 @ 9:15 am at [facility]. Interpreter will meet you at [facility]. Do not eat or drink 4 hours prior to having this test.  Barium swallow scheduled 08/17/2020 @ 8:30 at [facility] entrance. The interpreter will meet you inside the [place].    Referral has been sent to [facility]. Someone form their office will call to schedule an appointment.   Please see your follow up appointment listed below.

## 2020-08-01 ENCOUNTER — Encounter: Payer: Self-pay | Admitting: Surgery

## 2020-08-01 NOTE — Progress Notes (Signed)
Patient ID: Wanda Powers, female   DOB: 06-28-1976, 45 y.o.   MRN: [id number]  HPI Wanda Powers is a 45 y.o. female  Seen in consultation at the request of Dr. Maudie Mercury for nonspecific GI issues. She complains of intermittent abdominal discomfort epigastric area and sometimes in the flanks. The pain is intermittent mild to moderate intensity and dull. She did have a prior history of cholecystectomy more than 5 years ago. She states that ever since that she is still has some GI issues. She experiences chronic cough as well as some reflux. No fevers no chills. She recently had a chest x-ray by her primary care showing evidence of hiatal hernia. I have personally reviewed the notes and the report. Unfortunately I do not have access to the actual images. I was very transparent with the patient and she showed understanding. She has also had a history of a C-section. She is able to perform more than 4 METS of activity without any shortness with or chest pain. She denies any emesis.    HPI  Past Medical History:  Diagnosis Date  . Gestational diabetes 2013  . Headache   . Pyelonephritis 1996    Past Surgical History:  Procedure Laterality Date  . CESAREAN SECTION  2004   x 1  . CHOLECYSTECTOMY    . LAPAROSCOPIC CHOLECYSTECTOMY SINGLE SITE WITH INTRAOPERATIVE CHOLANGIOGRAM N/A 12/25/2014   Procedure: LAPAROSCOPIC CHOLECYSTECTOMY SINGLE SITE WITH INTRAOPERATIVE CHOLANGIOGRAM;  Surgeon: Michael Boston, MD;  Location: [facility];  Service: General;  Laterality: N/A;    Family History  Problem Relation Age of Onset  . Hyperlipidemia Mother   . Hypertension Mother   . Cancer Mother   . Heart attack Neg Hx   . Diabetes Neg Hx   . Sudden death Neg Hx     Social History Social History   Tobacco Use  . Smoking status: Never Smoker  . Smokeless tobacco: Never Used  Vaping Use  . Vaping Use: Never used  Substance Use Topics  . Alcohol use: No  . Drug use: No     No Known Allergies  No current outpatient medications on file.   No current facility-administered medications for this visit.     Review of Systems Full ROS  was asked and was negative except for the information on the HPI  Physical Exam Blood pressure 119/79, pulse (!) 58, temperature 98.7 F (37.1 C), temperature source Oral, height 5\' 4"  (1.626 m), weight 158 lb (71.7 kg), SpO2 98 %. CONSTITUTIONAL: NAD EYES: Pupils are equal, round, and reactive to light, Sclera are non-icteric. EARS, NOSE, MOUTH AND THROAT: The oropharynx is clear. The oral mucosa is pink and moist. Hearing is intact to voice. LYMPH NODES:  Lymph nodes in the neck are normal. RESPIRATORY:  Lungs are clear. There is normal respiratory effort, with equal breath sounds bilaterally, and without pathologic use of accessory muscles. CARDIOVASCULAR: Heart is regular without murmurs, gallops, or rubs. GI: The abdomen is  soft, nontender, and nondistended. There are no palpable masses. There is no hepatosplenomegaly. There are normal bowel sounds in all quadrants. GU: Rectal deferred.   MUSCULOSKELETAL: Normal muscle strength and tone. No cyanosis or edema.   SKIN: Turgor is good and there are no pathologic skin lesions or ulcers. NEUROLOGIC: Motor and sensation is grossly normal. Cranial nerves are grossly intact. PSYCH:  Oriented to person, place and time. Affect is normal.  Data Reviewed  I have personally reviewed the patient's imaging, laboratory findings  and medical records.    Assessment/Plan 45 year old female with presumed hiatal hernia that is symptomatic. This was detected on a screening chest x-ray. First of business is to establish ocular anatomy of the mediastinum and intra-abdominal cavity. I will order a CT scan of the chest and abdomen with contrast. Following that I would like to have barium swallow. I also do think that she will need an endoscopic evaluation and we will make appropriate  arrangements. Discussed with the patient in detail about her disease process. Prior to doing any potential intervention we will have to establish a clear diagnosis. I had an extensive discussion with the patient regarding her disease process and my thought process. She is appreciative but did have a lot of questions. I responded to my best of my abilities. She seems very content A copy of this report was sent to the referring provider  Time spent with the patient was 65 minutes, with more than 50% of the time spent in face-to-face education, counseling and care coordination.     Caroleen Hamman, MD FACS General Surgeon 08/01/2020, 1:00 PM

## 2020-08-10 ENCOUNTER — Other Ambulatory Visit: Payer: Self-pay

## 2020-08-10 ENCOUNTER — Ambulatory Visit
Admission: RE | Admit: 2020-08-10 | Discharge: 2020-08-10 | Disposition: A | Discharge status: Home / Self Care | Payer: 59 | Source: Ambulatory Visit | Attending: Surgery | Admitting: Surgery

## 2020-08-10 DIAGNOSIS — K449 Diaphragmatic hernia without obstruction or gangrene: Secondary | ICD-10-CM

## 2020-08-17 ENCOUNTER — Ambulatory Visit: Admission: RE | Admit: 2020-08-17 | Payer: 59 | Source: Ambulatory Visit

## 2020-08-17 ENCOUNTER — Other Ambulatory Visit: Payer: Self-pay

## 2020-08-17 ENCOUNTER — Other Ambulatory Visit: Payer: Self-pay | Admitting: Surgery

## 2020-08-17 ENCOUNTER — Ambulatory Visit
Admission: RE | Admit: 2020-08-17 | Discharge: 2020-08-17 | Disposition: A | Discharge status: Home / Self Care | Payer: 59 | Source: Ambulatory Visit | Attending: Surgery | Admitting: Surgery

## 2020-08-17 ENCOUNTER — Inpatient Hospital Stay: Admission: RE | Admit: 2020-08-17 | Payer: Self-pay | Source: Ambulatory Visit

## 2020-08-17 ENCOUNTER — Telehealth: Payer: Self-pay

## 2020-08-17 DIAGNOSIS — K449 Diaphragmatic hernia without obstruction or gangrene: Secondary | ICD-10-CM

## 2020-08-17 MED ORDER — IOHEXOL 300 MG/ML  SOLN
100.0000 mL | Freq: Once | INTRAMUSCULAR | Status: AC | PRN
  Administered 2020-08-17: 100 mL via INTRAVENOUS

## 2020-08-17 NOTE — Telephone Encounter (Signed)
Patient notified of results per Dr.Pabon-scheduled follow up appointment 09/14/20 @ 9:15.

## 2020-09-02 ENCOUNTER — Other Ambulatory Visit: Payer: Self-pay

## 2020-09-02 ENCOUNTER — Encounter: Payer: Self-pay | Admitting: Gastroenterology

## 2020-09-02 ENCOUNTER — Ambulatory Visit: Payer: No Typology Code available for payment source | Admitting: Gastroenterology

## 2020-09-12 ENCOUNTER — Encounter (HOSPITAL_COMMUNITY): Payer: Self-pay | Admitting: Emergency Medicine

## 2020-09-12 ENCOUNTER — Emergency Department (HOSPITAL_COMMUNITY)
Admission: EM | Admit: 2020-09-12 | Discharge: 2020-09-12 | Disposition: A | Discharge status: Home / Self Care | Payer: 59 | Attending: Emergency Medicine | Admitting: Emergency Medicine

## 2020-09-12 ENCOUNTER — Emergency Department (HOSPITAL_COMMUNITY): Payer: 59

## 2020-09-12 ENCOUNTER — Other Ambulatory Visit: Payer: Self-pay

## 2020-09-12 DIAGNOSIS — M5432 Sciatica, left side: Secondary | ICD-10-CM | POA: Insufficient documentation

## 2020-09-12 DIAGNOSIS — M25552 Pain in left hip: Secondary | ICD-10-CM | POA: Diagnosis present

## 2020-09-12 MED ORDER — KETOROLAC TROMETHAMINE 60 MG/2ML IM SOLN
60.0000 mg | Freq: Once | INTRAMUSCULAR | Status: AC
  Administered 2020-09-12: 60 mg via INTRAMUSCULAR
  Filled 2020-09-12: qty 2

## 2020-09-12 MED ORDER — IBUPROFEN 600 MG PO TABS: 600.0000 mg | ORAL_TABLET | Freq: Four times a day (QID) | ORAL | 0 refills | Status: DC | PRN

## 2020-09-12 NOTE — Discharge Instructions (Signed)
Your left leg discomfort is likely related to sciatic nerve impingement.  This is called sciatica.  Please read the attachment above.  Your x-ray obtained here in the ED was without any fracture, dislocation, bony lesion, or any other bony findings.  However, this does not exclude muscular discomfort, tendinous or ligamentous injury, or nerve impingement such as with sciatica.  Return to ED for any new or worsening symptoms.  I recommend stretching exercise and taking NSAIDs such as ibuprofen 600 mg every 6 hours, as prescribed.    Please follow-up with your primary care provider regarding today's encounter for ongoing evaluation and management.  Es probable que Sales executive en la pierna izquierda est relacionada con el pinzamiento del nervio citico. Esto se llama citica. Por favor, lea el archivo adjunto de [place].  Su radiografa obtenida aqu en el servicio de urgencias no mostr ninguna fractura, dislocacin, lesin sea ni ningn otro hallazgo seo. Sin embargo, esto no excluye las [employer], las lesiones tendinosas o ligamentosas o el pinzamiento de los nervios, como en el caso de la citica.  Recomiendo hacer ejercicio de estiramiento y tomar AINE como ibuprofeno 600 mg cada 6 horas, segn prescripcin mdica.  Haga un seguimiento con su proveedor de atencin primaria con respecto al encuentro de hoy para una evaluacin y [place] continuos.

## 2020-09-12 NOTE — ED Provider Notes (Addendum)
[facility] EMERGENCY DEPARTMENT Provider Note   CSN: [id number] Arrival date & time: 09/12/20  1738     History Chief Complaint  Patient presents with  . Hip Pain  . Leg Pain    Iliyana Angelica Agripina Powers is a 45 y.o. female with PMH of cholecystectomy, C-section, and planned surgical intervention for hiatal hernia who presents to the ED with complaints of atraumatic left-sided hip discomfort that radiates inferiorly to left knee and sometimes to her left ankle.  She describes this pain as sharp and with paresthesias.  Denies any true numbness or weakness.  States that her symptoms are exacerbated by movement.  She has not tried anything for her symptoms because she wants to first know exactly what she is treating.  She states that her primary care provider advised her to come to the ED for an x-ray since she is out of the office for the next couple of weeks.  She does not take any medications regularly.  Denies any IVDA, fevers or chills, swelling, rash, true numbness or weakness, history of cancer, night sweats, precipitating injury or trauma, or other symptoms.  Patient adamantly denies possibility of pregnancy.    During HPI, assessment, and plan, patient insisted that she did not require a translator she could understand everything that I am telling her/asking her.    HPI     Past Medical History:  Diagnosis Date  . Gestational diabetes 2013  . Headache   . Pyelonephritis 1996    Patient Active Problem List   Diagnosis Date Noted  . Mixed hyperlipidemia 07/27/2020  . Other seasonal allergic rhinitis 07/27/2020  . Skin sensation disturbance 07/27/2020  . Vitamin D deficiency 07/27/2020  . Pruritus 02/28/2018  . Chronic cholecystitis 12/25/2014  . 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 Common bile duct dilation 10/17/2014  . Biliary colic 93/26/7124  . Tension headache 09/09/2014  . Hypopigmented skin lesion 08/02/2013  . History of cesarean  delivery 07/02/2012    Past Surgical History:  Procedure Laterality Date  . CESAREAN SECTION  2004   x 1  . CHOLECYSTECTOMY    . LAPAROSCOPIC CHOLECYSTECTOMY SINGLE SITE WITH INTRAOPERATIVE CHOLANGIOGRAM N/A 12/25/2014   Procedure: LAPAROSCOPIC CHOLECYSTECTOMY SINGLE SITE WITH INTRAOPERATIVE CHOLANGIOGRAM;  Surgeon: Michael Boston, MD;  Location: [facility];  Service: General;  Laterality: N/A;     OB History    Gravida  4   Para  3   Term  3   Preterm      AB      Living  3     SAB      IAB      Ectopic      Multiple      Live Births              Family History  Problem Relation Age of Onset  . Hyperlipidemia Mother   . Hypertension Mother   . Cancer Mother   . Heart attack Neg Hx   . Diabetes Neg Hx   . Sudden death Neg Hx     Social History   Tobacco Use  . Smoking status: Never Smoker  . Smokeless tobacco: Never Used  Vaping Use  . Vaping Use: Never used  Substance Use Topics  . Alcohol use: No  . Drug use: No    Home Medications Prior to Admission medications   Medication Sig Start Date End Date Taking? Authorizing Provider  ibuprofen (ADVIL) 600 MG tablet Take 1 tablet (600 mg  total) by mouth every 6 (six) hours as needed. 09/12/20  Yes Corena Herter, PA-C  cetirizine (ZYRTEC) 10 MG tablet Take 10 mg by mouth daily. 08/26/20   [provider]    Allergies    Patient has no known allergies.  Review of Systems   Review of Systems  All other systems reviewed and are negative.   Physical Exam Updated Vital Signs BP 112/74 (BP Location: Right Arm)   Pulse 81   Temp 98.4 F (36.9 C)   Resp 16   SpO2 98%   Physical Exam Vitals and nursing note reviewed. Exam conducted with a chaperone present.  Constitutional:      General: She is not in acute distress.    Appearance: Normal appearance. She is not toxic-appearing.  HENT:     Head: Normocephalic and atraumatic.  Eyes:     General: No scleral icterus.    Conjunctiva/sclera:  Conjunctivae normal.  Cardiovascular:     Rate and Rhythm: Normal rate.     Pulses: Normal pulses.  Pulmonary:     Effort: Pulmonary effort is normal. No respiratory distress.  Abdominal:     General: Abdomen is flat. There is no distension.     Palpations: Abdomen is soft.     Tenderness: There is no abdominal tenderness.  Musculoskeletal:        General: Tenderness present. No swelling, deformity or signs of injury. Normal range of motion.     Right lower leg: No edema.     Left lower leg: No edema.     Comments: Tenderness noted over area of left proximal femur.  Hip flexion and extension with strength intact against resistance.  Peripheral pulses intact and symmetric.  No overlying skin changes.  Positive left left-sided SLR.  Negative right-sided SLR.  No midline spinal TTP.  Sensation gross intact throughout.  No swelling noted and compartments are soft.  Skin:    General: Skin is dry.     Capillary Refill: Capillary refill takes less than 2 seconds.  Neurological:     General: No focal deficit present.     Mental Status: She is alert and oriented to person, place, and time.     GCS: GCS eye subscore is 4. GCS verbal subscore is 5. GCS motor subscore is 6.     Cranial Nerves: No cranial nerve deficit.     Sensory: No sensory deficit.     Motor: No weakness.     Coordination: Coordination normal.     Gait: Gait normal.  Psychiatric:        Mood and Affect: Mood normal.        Behavior: Behavior normal.        Thought Content: Thought content normal.     ED Results / Procedures / Treatments   Labs (all labs ordered are listed, but only abnormal results are displayed) Labs Reviewed - No data to display  EKG None  Radiology DG Hip Unilat W or Wo Pelvis 2-3 Views Left  Result Date: 09/12/2020 CLINICAL DATA:  Left hip pain. EXAM: DG HIP (WITH OR WITHOUT PELVIS) 2-3V LEFT COMPARISON:  None. FINDINGS: There is no evidence of hip fracture or dislocation. There is no evidence  of arthropathy or other focal bone abnormality. IMPRESSION: Negative. Electronically Signed   By: Marijo Conception M.D.   On: 09/12/2020 18:51    Procedures Procedures   Medications Ordered in ED Medications  ketorolac (TORADOL) injection 60 mg (has no administration in  time range)    ED Course  I have reviewed the triage vital signs and the nursing notes.  Pertinent labs & imaging results that were available during my care of the patient were reviewed by me and considered in my medical decision making (see chart for details).    MDM Rules/Calculators/A&P                          Rosamond Angelica Rishita Petron was evaluated in Emergency Department on 09/12/2020 for the symptoms described in the history of present illness. She was evaluated in the context of the global COVID-19 pandemic, which necessitated consideration that the patient might be at risk for infection with the SARS-CoV-2 virus that causes COVID-19. Institutional protocols and algorithms that pertain to the evaluation of patients at risk for COVID-19 are in a state of rapid change based on information released by regulatory bodies including the CDC and federal and state organizations. These policies and algorithms were followed during the patient's care in the ED.  I personally reviewed patient's medical chart and all notes from triage and staff during today's encounter. I have also ordered and reviewed all labs and imaging that I felt to be medically necessary in the evaluation of this patient's complaints and with consideration of their physical exam. Offered translator services twice to ensure good communication, but patient declined and was able to repeat everything that I was telling her and voiced good understanding.    Patient's history and physical exam is suggestive of left-sided radicular symptoms.  However, often I see this in conjunction with left lower back discomfort rather than left-sided hip discomfort.  She is  also tender on my exam, but denies any precipitating injury.  There is no swelling or erythema on my exam.  Low suspicion for septic or inflammatory joint.  Will obtain plain films of left hip and pelvis for further clarification.  If negative, suspect that this is musculoskeletal.  Patient is functionally neurovascular intact.  She is adamant that she is not pregnant and denies any history of PUD or bleeding disorder.  We will treat with Toradol IM here in the ED and then encourage continued ibuprofen 600 mg 6 hours as needed for pain control.  She can follow-up with her primary care provider for ongoing evaluation and management.    Patient reassured by today's work-up.  ED return precautions discussed.  Patient voices understanding and is agreeable to the plan.  Final Clinical Impression(s) / ED Diagnoses Final diagnoses:  Sciatica of left side    Rx / DC Orders ED Discharge Orders         Ordered    ibuprofen (ADVIL) 600 MG tablet  Every 6 hours PRN        09/12/20 1906           Corena Herter, PA-C 09/12/20 1950    Corena Herter, PA-C 09/12/20 1953    Hayden Rasmussen, MD 09/13/20 1106

## 2020-09-12 NOTE — ED Triage Notes (Signed)
C/o L hip pain that radiates to L knee x 3 months.  Pain improves with ice and gets worse after lifting weights.

## 2020-09-14 ENCOUNTER — Encounter: Payer: Self-pay | Admitting: Surgery

## 2020-09-14 ENCOUNTER — Other Ambulatory Visit: Payer: Self-pay

## 2020-09-14 ENCOUNTER — Telehealth: Payer: Self-pay | Admitting: Surgery

## 2020-09-14 ENCOUNTER — Ambulatory Visit (INDEPENDENT_AMBULATORY_CARE_PROVIDER_SITE_OTHER): Payer: 59 | Admitting: Surgery

## 2020-09-14 VITALS — BP 114/77 | HR 62 | Temp 98.5°F | Ht 64.0 in | Wt 160.0 lb

## 2020-09-14 DIAGNOSIS — K449 Diaphragmatic hernia without obstruction or gangrene: Secondary | ICD-10-CM | POA: Diagnosis not present

## 2020-09-14 DIAGNOSIS — K432 Incisional hernia without obstruction or gangrene: Secondary | ICD-10-CM | POA: Diagnosis not present

## 2020-09-14 MED ORDER — OMEPRAZOLE 40 MG PO CPDR: 40.0000 mg | DELAYED_RELEASE_CAPSULE | Freq: Every day | ORAL | 3 refills | Status: DC

## 2020-09-14 NOTE — Telephone Encounter (Signed)
Outgoing call is made, left message for patient to call.  Please advise patient of Pre-Admission date/time, COVID Testing date and Surgery date.  Surgery Date: 09/24/20 Preadmission Testing Date: 09/17/20 (phone 1p to 5p) Covid Testing Date: 09/22/20 - patient advised to go to the [facility] ([place]) between 8a-1p   Also patient to call at [phone], between 1-3:00pm the day before surgery, to find out what time to arrive for surgery.

## 2020-09-14 NOTE — Patient Instructions (Addendum)
haremos que [facility] se comunique con usted para programar su visita.   nuestro programador de [place] lo llamar para ver las fechas de la ciruga y repasar la informacin para la [place]. por favor refirase a su hoja de ciruga azul  Hernia ventral Ventral Hernia  Una hernia ventral es un bulto de tejido del interior del abdomen que empuja y sale a travs de una zona dbil de los msculos que forman la pared abdominal frontal. Los tejidos dentro del abdomen se encuentran en un saco (peritoneo). Estos tejidos incluyen el intestino delgado, el intestino grueso y el tejido graso que recubre el intestino (epipln). Algunas veces, el bulto que forma una hernia contiene intestinos. Otras hernias contienen solo grasa. Las hernias ventrales no desaparecen sin tratamiento quirrgico. Hay varios tipos de hernias ventrales. Es posible que tenga lo siguiente:  Una hernia en el lugar de una incisin de Ardelia Mems ciruga abdominal previa (hernia incisional).  Una hernia justo por encima del ombligo (hernia epigstrica), o en el ombligo (hernia umbilical). Estos tipos de hernias pueden desarrollarse por levantar objetos pesados o hacer mucha fuerza.  Una hernia que aparece y desaparece (hernia reducible). Puede ser visible solamente cuando usted levanta algo o hace fuerza. Este tipo de hernia se puede reintroducir en el abdomen (reducir).  Una hernia que aprisiona tejido abdominal (hernia encarcelada). Este tipo de hernia es irreducible.  Una hernia que interrumpe el flujo de sangre a los tejidos en su interior (hernia estrangulada). Si esto ocurre, los tejidos Radio broadcast assistant a Pharmacologist. Se trata de un bulto muy doloroso e irreducible. Una hernia estrangulada es una emergencia mdica. Cules son las causas? Esta afeccin sucede cuando el tejido abdominal presiona una zona dbil de los msculos abdominales. Qu incrementa el riesgo? Los siguientes factores pueden hacer que sea ms propenso a  Armed forces training and education officer afeccin:  Ser mayor de 25 aos de edad.  Tener sobrepeso u obesidad.  Haberse sometido a ciruga abdominal en el pasado, especialmente si se produjo una infeccin luego de la [place].  Haber sufrido una lesin en la pared abdominal.  Levantar o empujar con frecuencia objetos pesados.  Haber tenido varios embarazos.  Tener una acumulacin de lquido dentro del abdomen (ascitis).  Hacer esfuerzo para Systems developer.  Tener episodios frecuentes de tos. Cules son los signos o sntomas? El nico sntoma de una hernia ventral puede ser un bulto indoloro en el abdomen. Una hernia reducible puede ser visible solo al [employer], toser o Surveyor, quantity. Otros sntomas pueden incluir lo siguiente:  Dolor sordo.  Sensacin de opresin. Los signos y sntomas de una hernia estrangulada pueden incluir los siguientes:  Aumento del Social research officer, government.  Nuseas y vmitos.  Dolor al ejercer presin sobre la hernia.  La piel que recubre la hernia se torna roja o violcea.  Estreimiento.  Sangre en la materia fecal (heces). Cmo se diagnostica? Esta afeccin se puede diagnosticar en funcin de lo siguiente:  Sus sntomas.  Sus antecedentes mdicos.  Un examen fsico. Pueden pedirle que tosa o que haga fuerza mientras est de pie. Estas acciones aumentan la presin dentro del abdomen y empujan la hernia a travs de la abertura en los msculos. El mdico puede intentar reducir la hernia empujndola suavemente hacia dentro.  Estudios de diagnstico por imgenes, como una ecografa o una exploracin por tomografa computarizada (TC). Cmo se trata? Esta afeccin se trata con ciruga. Si la hernia est estrangulada, la ciruga se realiza lo antes posible. Si la hernia es pequea y no  est encarcelada, es posible que se le pida que pierda algo de peso antes de la [place]. Siga estas instrucciones en su casa:  Siga las instrucciones del mdico respecto de las restricciones de  comidas o bebidas.  Si tiene sobrepeso, el mdico puede recomendarle que aumente su nivel de [place] y que coma una dieta saludable.  No levante ningn objeto que pese ms de10 libras (4.5kg) o que supere el lmite de peso que le hayan indicado, hasta que el mdico le diga que puede [place].  Retome sus actividades normales segn lo indicado por el mdico. Pregntele al mdico qu actividades son seguras para usted. Tal vez deba evitar las actividades que aumentan la presin sobre la hernia.  Use los medicamentos de venta libre y los recetados solamente como se lo haya indicado el mdico.  Cumpla con todas las visitas de seguimiento. Esto es importante. Comunquese con un mdico si:  La hernia se agranda.  La hernia le causa dolor. Solicite ayuda de inmediato si:  La hernia duele cada vez ms.  Tiene dolor junto con cualquiera de los siguientes sntomas: ? Cambios en el color de la piel en la zona de la hernia. ? Nuseas. ? Vmitos. ? Fiebre. Estos sntomas pueden representar un problema grave que constituye Engineer, maintenance (IT). No espere a ver si los sntomas desaparecen. Solicite atencin mdica de inmediato. Comunquese con el servicio de emergencias de su localidad (911 en los [place]). No conduzca por sus propios medios [employer] hospital. Resumen  Una hernia ventral es un bulto de tejido del interior del abdomen que empuja y sale a travs de una zona dbil de los msculos que forman la pared abdominal frontal.  Esta afeccin se trata con ciruga, que puede ser de urgencia segn la hernia.  No levante nada que pese ms de 10 libras (4.5 kg), y siga las indicaciones del mdico sobre la [place]. Esta informacin no tiene Marine scientist el consejo del mdico. Asegrese de hacerle al mdico cualquier pregunta que tenga. Document Revised: 05/08/2020 Document Reviewed: 05/08/2020 Elsevier Patient Education  2021 [employer].

## 2020-09-14 NOTE — Telephone Encounter (Signed)
Incoming call from patient, she is now aware of all dates regarding her surgery and voices understanding.

## 2020-09-14 NOTE — Progress Notes (Signed)
Outpatient Surgical Follow Up  09/14/2020  Wanda Powers is an 45 y.o. female.   Chief Complaint  Patient presents with  . Follow-up    HPI: Wanda Powers is a 45-year-old female well-known to me following up for reflux as well as abdominal pain.  She has completed her work-up to include a CT scan of the abdomen and pelvis as well as a barium swallow.  Please note that I have personally reviewed the images showing evidence of a 2 cm incisional hernia above the umbilicus from prior cholecystectomy port.  There is evidence of significant reflux.  There is no evidence of a significant hiatal hernia.  She is otherwise doing okay.  She complains of abdominal pain specifically 1 that is intermittent worsening with Valsalva and around the umbilicus.  It radiates to the upper abdomen.  No fevers no chills no evidence of incarceration or strangulation.  Past Medical History:  Diagnosis Date  . Gestational diabetes 2013  . Headache   . Pyelonephritis 1996    Past Surgical History:  Procedure Laterality Date  . CESAREAN SECTION  2004   x 1  . CHOLECYSTECTOMY    . LAPAROSCOPIC CHOLECYSTECTOMY SINGLE SITE WITH INTRAOPERATIVE CHOLANGIOGRAM N/A 12/25/2014   Procedure: LAPAROSCOPIC CHOLECYSTECTOMY SINGLE SITE WITH INTRAOPERATIVE CHOLANGIOGRAM;  Surgeon: Steven Gross, MD;  Location: [facility];  Service: General;  Laterality: N/A;    Family History  Problem Relation Age of Onset  . Hyperlipidemia Mother   . Hypertension Mother   . Cancer Mother   . Heart attack Neg Hx   . Diabetes Neg Hx   . Sudden death Neg Hx     Social History:  reports that she has never smoked. She has never used smokeless tobacco. She reports that she does not drink alcohol and does not use drugs.  Allergies: No Known Allergies  Medications reviewed.    ROS Full ROS performed and is otherwise negative other than what is stated in HPI   BP 114/77   Pulse 62   Temp 98.5 F (36.9 C)   Ht 5' 4" (1.626 m)    Wt 160 lb (72.6 kg)   SpO2 97%   BMI 27.46 kg/m   Physical Exam Vitals and nursing note reviewed. Exam conducted with a chaperone present.  Constitutional:      General: She is not in acute distress.    Appearance: Normal appearance. She is normal weight.  Eyes:     General:        Right eye: No discharge.        Left eye: No discharge.     Extraocular Movements: Extraocular movements intact.  Cardiovascular:     Rate and Rhythm: Normal rate and regular rhythm.     Pulses: Normal pulses.     Heart sounds: Normal heart sounds. No murmur heard.   Pulmonary:     Effort: Pulmonary effort is normal. No respiratory distress.     Breath sounds: Normal breath sounds. No stridor.  Abdominal:     General: Abdomen is flat. There is no distension.     Palpations: Abdomen is soft. There is no mass.     Tenderness: There is no guarding or rebound.     Hernia: A hernia is present.     Comments: Small reducible incisional hernia located supraumbilically.  Reduces easily  Musculoskeletal:     Cervical back: Normal range of motion and neck supple. No rigidity or tenderness.  Lymphadenopathy:     Cervical: No   cervical adenopathy.  Skin:    General: Skin is warm and dry.     Capillary Refill: Capillary refill takes less than 2 seconds.  Neurological:     General: No focal deficit present.     Mental Status: She is alert and oriented to person, place, and time.  Psychiatric:        Mood and Affect: Mood normal.        Behavior: Behavior normal.        Thought Content: Thought content normal.        Judgment: Judgment normal.    Assessment/Plan: 45 year old female with symptomatic incisional hernia from prior cholecystectomy and significant reflux disease.  Only what seems to be the most symptomatic hernia.  Discussed.ATTEST About options and my recommendation to proceed with repair with mesh.  Given the location I do think that will be reasonable to offer her a traditional hernia repair  with mesh.  Procedure discussed with patient in detail.  Risks, benefits and possible applications including but not limited to: Bleeding, infection, injury to adjacent structures and mesh issues.  She understands and wishes to proceed.  Regarding her GERD she still needs to complete the upper endoscopy and I will start her on a daily PPI.  Once again her incisional hernia seems to be the most symptomatic at this time.  Discussed with her in detail that refluxusuallyrespondstomedicaltherapy Nissen fundoplication will be indicated for selective cases.  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  We will schedule ventral hernia repair in about 10 days  Greater than 50% of the 46 minutes  visit was spent in counseling/coordination of care   Caroleen Hamman, MD [facility] Surgeon

## 2020-09-14 NOTE — H&P (View-Only) (Signed)
Outpatient Surgical Follow Up  9/52/8413  Wanda Powers is an 45 y.o. female.   Chief Complaint  Patient presents with  . Follow-up    HPI: [name] is a 45 year old female well-known to me following up for reflux as well as abdominal pain.  She has completed her work-up to include a CT scan of the abdomen and pelvis as well as a barium swallow.  Please note that I have personally reviewed the images showing evidence of a 2 cm incisional hernia above the umbilicus from prior cholecystectomy port.  There is evidence of significant reflux.  There is no evidence of a significant hiatal hernia.  She is otherwise doing okay.  She complains of abdominal pain specifically 1 that is intermittent worsening with Valsalva and around the umbilicus.  It radiates to the upper abdomen.  No fevers no chills no evidence of incarceration or strangulation.  Past Medical History:  Diagnosis Date  . Gestational diabetes 2013  . Headache   . Pyelonephritis 1996    Past Surgical History:  Procedure Laterality Date  . CESAREAN SECTION  2004   x 1  . CHOLECYSTECTOMY    . LAPAROSCOPIC CHOLECYSTECTOMY SINGLE SITE WITH INTRAOPERATIVE CHOLANGIOGRAM N/A 12/25/2014   Procedure: LAPAROSCOPIC CHOLECYSTECTOMY SINGLE SITE WITH INTRAOPERATIVE CHOLANGIOGRAM;  Surgeon: Michael Boston, MD;  Location: [facility];  Service: General;  Laterality: N/A;    Family History  Problem Relation Age of Onset  . Hyperlipidemia Mother   . Hypertension Mother   . Cancer Mother   . Heart attack Neg Hx   . Diabetes Neg Hx   . Sudden death Neg Hx     Social History:  reports that she has never smoked. She has never used smokeless tobacco. She reports that she does not drink alcohol and does not use drugs.  Allergies: No Known Allergies  Medications reviewed.    ROS Full ROS performed and is otherwise negative other than what is stated in HPI   BP 114/77   Pulse 62   Temp 98.5 F (36.9 C)   Ht 5\' 4"  (1.626 m)    Wt 160 lb (72.6 kg)   SpO2 97%   BMI 27.46 kg/m   Physical Exam Vitals and nursing note reviewed. Exam conducted with a chaperone present.  Constitutional:      General: She is not in acute distress.    Appearance: Normal appearance. She is normal weight.  Eyes:     General:        Right eye: No discharge.        Left eye: No discharge.     Extraocular Movements: Extraocular movements intact.  Cardiovascular:     Rate and Rhythm: Normal rate and regular rhythm.     Pulses: Normal pulses.     Heart sounds: Normal heart sounds. No murmur heard.   Pulmonary:     Effort: Pulmonary effort is normal. No respiratory distress.     Breath sounds: Normal breath sounds. No stridor.  Abdominal:     General: Abdomen is flat. There is no distension.     Palpations: Abdomen is soft. There is no mass.     Tenderness: There is no guarding or rebound.     Hernia: A hernia is present.     Comments: Small reducible incisional hernia located supraumbilically.  Reduces easily  Musculoskeletal:     Cervical back: Normal range of motion and neck supple. No rigidity or tenderness.  Lymphadenopathy:     Cervical: No  cervical adenopathy.  Skin:    General: Skin is warm and dry.     Capillary Refill: Capillary refill takes less than 2 seconds.  Neurological:     General: No focal deficit present.     Mental Status: She is alert and oriented to person, place, and time.  Psychiatric:        Mood and Affect: Mood normal.        Behavior: Behavior normal.        Thought Content: Thought content normal.        Judgment: Judgment normal.    Assessment/Plan: 45 year old female with symptomatic incisional hernia from prior cholecystectomy and significant reflux disease.  Only what seems to be the most symptomatic hernia.  Discussed.ATTEST About options and my recommendation to proceed with repair with mesh.  Given the location I do think that will be reasonable to offer her a traditional hernia repair  with mesh.  Procedure discussed with patient in detail.  Risks, benefits and possible applications including but not limited to: Bleeding, infection, injury to adjacent structures and mesh issues.  She understands and wishes to proceed.  Regarding her GERD she still needs to complete the upper endoscopy and I will start her on a daily PPI.  Once again her incisional hernia seems to be the most symptomatic at this time.  Discussed with her in detail that refluxusuallyrespondstomedicaltherapy Nissen fundoplication will be indicated for selective cases.  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  We will schedule ventral hernia repair in about 10 days  Greater than 50% of the 46 minutes  visit was spent in counseling/coordination of care   Caroleen Hamman, MD [place] Surgeon

## 2020-09-15 ENCOUNTER — Other Ambulatory Visit: Payer: Self-pay

## 2020-09-15 ENCOUNTER — Encounter: Payer: Self-pay | Admitting: Gastroenterology

## 2020-09-15 ENCOUNTER — Ambulatory Visit (INDEPENDENT_AMBULATORY_CARE_PROVIDER_SITE_OTHER): Payer: 59 | Admitting: Gastroenterology

## 2020-09-15 VITALS — BP 113/67 | HR 69 | Temp 98.6°F | Ht 64.0 in | Wt 161.2 lb

## 2020-09-15 DIAGNOSIS — K219 Gastro-esophageal reflux disease without esophagitis: Secondary | ICD-10-CM

## 2020-09-15 DIAGNOSIS — Z1211 Encounter for screening for malignant neoplasm of colon: Secondary | ICD-10-CM | POA: Diagnosis not present

## 2020-09-15 DIAGNOSIS — R1310 Dysphagia, unspecified: Secondary | ICD-10-CM | POA: Diagnosis not present

## 2020-09-15 MED ORDER — NA SULFATE-K SULFATE-MG SULF 17.5-3.13-1.6 GM/177ML PO SOLN: 354.0000 mL | Freq: Once | ORAL | 0 refills | Status: AC

## 2020-09-15 NOTE — Progress Notes (Signed)
Cephas Darby, MD [address]  [place]  [place], [place] [address]  Main: [phone]  Fax: [phone]    Gastroenterology Consultation  Referring Provider:     Wilber Oliphant, MD Primary Care Physician:  Trey Sailors, [place] Primary Gastroenterologist:  Dr. Cephas Darby Reason for Consultation:     Heartburn        HPI:   Wanda Powers is a 45 y.o. female referred by Dr. Trey Sailors, PA  for consultation & management of heartburn.  Patient reports that for last few months, she has been experiencing burning in her chest radiating from upper abdomen.  She had history of cholecystectomy in 12/2014.  She has been complaining of right periumbilical discomfort, she has seen Dr. Dahlia Byes who is planning to repair fat-containing ventral hernia on 3/10.  He also started her on omeprazole 40 mg daily which she just started taking since yesterday.  She underwent CT chest abdomen and pelvis, no evidence of hiatal hernia.  She is also experiencing sensation of food stuck in her throat.  Her labs are unremarkable  She drinks coffee about 3 times a week  NSAIDs: None  Antiplts/Anticoagulants/Anti thrombotics: None  GI Procedures: None She denies family history of GI malignancy She does not smoke or drink alcohol  Past Medical History:  Diagnosis Date  . Gestational diabetes 2013  . Headache   . Pyelonephritis 1996    Past Surgical History:  Procedure Laterality Date  . CESAREAN SECTION  2004   x 1  . CHOLECYSTECTOMY    . LAPAROSCOPIC CHOLECYSTECTOMY SINGLE SITE WITH INTRAOPERATIVE CHOLANGIOGRAM N/A 12/25/2014   Procedure: LAPAROSCOPIC CHOLECYSTECTOMY SINGLE SITE WITH INTRAOPERATIVE CHOLANGIOGRAM;  Surgeon: Michael Boston, MD;  Location: [facility];  Service: General;  Laterality: N/A;    Current Outpatient Medications:  .  cetirizine (ZYRTEC) 10 MG tablet, Take 10 mg by mouth daily., Disp: , Rfl:  .  ibuprofen (ADVIL) 600 MG tablet, Take 1 tablet (600  mg total) by mouth every 6 (six) hours as needed., Disp: 30 tablet, Rfl: 0 .  omeprazole (PRILOSEC) 40 MG capsule, Take 1 capsule (40 mg total) by mouth daily., Disp: 30 capsule, Rfl: 3 .  Na Sulfate-K Sulfate-Mg Sulf 17.5-3.13-1.6 GM/177ML SOLN, Take 354 mLs by mouth once for 1 dose., Disp: 354 mL, Rfl: 0   Family History  Problem Relation Age of Onset  . Hyperlipidemia Mother   . Hypertension Mother   . Cancer Mother   . Heart attack Neg Hx   . Diabetes Neg Hx   . Sudden death Neg Hx      Social History   Tobacco Use  . Smoking status: Never Smoker  . Smokeless tobacco: Never Used  Vaping Use  . Vaping Use: Never used  Substance Use Topics  . Alcohol use: No  . Drug use: No    Allergies as of 09/15/2020  . (No Known Allergies)    Review of Systems:    All systems reviewed and negative except where noted in HPI.   Physical Exam:  BP 113/67 (BP Location: Left Arm, Patient Position: Sitting, Cuff Size: Normal)   Pulse 69   Temp 98.6 F (37 C) (Oral)   Ht 5\' 4"  (1.626 m)   Wt 161 lb 4 oz (73.1 kg)   BMI 27.68 kg/m  No LMP recorded. (Menstrual status: IUD).  General:   Alert,  Well-developed, well-nourished, pleasant and cooperative in NAD Head:  Normocephalic and atraumatic. Eyes:  Sclera clear, no icterus.   Conjunctiva pink. Ears:  Normal auditory acuity. Nose:  No deformity, discharge, or lesions. Mouth:  No deformity or lesions,oropharynx pink & moist. Neck:  Supple; no masses or thyromegaly. Lungs:  Respirations even and unlabored.  Clear throughout to auscultation.   No wheezes, crackles, or rhonchi. No acute distress. Heart:  Regular rate and rhythm; no murmurs, clicks, rubs, or gallops. Abdomen:  Normal bowel sounds. Soft, mild tenderness in right periumbilical area and non-distended without masses, hepatosplenomegaly or hernias noted.  No guarding or rebound tenderness.   Rectal: Not performed Msk:  Symmetrical without gross deformities. Good, equal  movement & strength bilaterally. Pulses:  Normal pulses noted. Extremities:  No clubbing or edema.  No cyanosis. Neurologic:  Alert and oriented x3;  grossly normal neurologically. Skin:  Intact without significant lesions or rashes. No jaundice. Psych:  Alert and cooperative. Normal mood and affect.  Imaging Studies: Reviewed  Assessment and Plan:   Wanda Powers is a 45 y.o. female with s/p cholecystectomy in 12/2014, fat-containing periumbilical hernia is seen in consultation for chronic heartburn as well as difficulty swallowing  Continue omeprazole 40 mg daily before breakfast Discussed about antireflux lifestyle, information provided Recommend upper endoscopy to evaluate for any structural lesions in esophagus, gastric and esophageal biopsies  Colon cancer screening Recommend screening colonoscopy and patient is agreeable   Follow up in 3 months   Cephas Darby, MD

## 2020-09-15 NOTE — Patient Instructions (Signed)
Enfermedad de reflujo gastroesofgico en los adultos Gastroesophageal Reflux Disease, Adult  El reflujo gastroesofgico (RGE) ocurre cuando el cido del estmago sube por el tubo que conecta la boca con el estmago (esfago). Normalmente, la comida baja por el esfago y se mantiene en el estmago, donde se la digiere. Cuando una persona tiene RGE, los alimentos y el cido estomacal suelen volver al esfago. Usted puede tener una enfermedad llamada enfermedad de reflujo gastroesofgico (ERGE) si el reflujo:  Sucede a menudo.  Causa sntomas frecuentes o muy intensos.  Causa problemas tales como dao en el esfago. Cuando esto ocurre, el esfago duele y se hincha. Con el tiempo, la ERGE puede ocasionar pequeos agujeros (lceras) en el revestimiento del esfago. Cules son las causas? Esta afeccin se debe a un problema en el msculo que se encuentra entre el esfago y [place]. Cuando este msculo est dbil o no es normal, no se cierra correctamente para impedir que los alimentos y el cido regresen del Paramedic. El msculo puede debilitarse debido a lo siguiente:  El consumo de [place].  [place].  Tener cierto tipo de hernia (hernia de hiato).  Consumo de alcohol.  Ciertos alimentos y bebidas, como caf, chocolate, cebollas y [place]. Qu incrementa el riesgo?  Tener sobrepeso.  Tener una enfermedad que afecta el tejido conjuntivo.  Tomar antiinflamatorios no esteroideos (AINE), como el ibuprofeno. Cules son los signos o sntomas?  Acidez estomacal.  Dificultad o dolor al tragar.  Sensacin de Best boy un bulto en la garganta.  Sabor amargo en la boca.  Mal aliento.  Tener una gran cantidad de saliva.  Estmago inflamado o con Tree surgeon.  Eructos.  Dolor en el pecho. El dolor de pecho puede deberse a distintas afecciones. Asegrese de Teacher, adult education a su mdico si tiene Tourist information centre manager.  Dificultad para respirar o sibilancias.  Ardelia Mems tos a largo plazo o tos  nocturna.  Desgaste de la superficie de los dientes (esmalte dental).  Prdida de peso. Cmo se trata?  Realizar cambios en la dieta.  Tomar medicamentos.  Someterse a [place]. El tratamiento depender de la gravedad de los sntomas. Siga estas instrucciones en su casa: Comida y bebida  Siga una dieta como se lo haya indicado el mdico. Es posible que deba evitar alimentos y bebidas, por ejemplo: ? Caf y t negro, con o sin cafena. ? Bebidas que contengan alcohol. ? Bebidas energticas y deportivas. ? Bebidas gaseosas (carbonatadas) y refrescos. ? Chocolate y cacao. ? Menta y [place]. ? Ajo y cebolla. ? [place]bano picante. ? Alimentos cidos y condimentados. Estos incluyen todos los tipos de pimientos, [place] en polvo, curry en polvo, vinagre, salsas picantes y [employer]. ? Ctricos y sus jugos, por ejemplo, naranjas, limones y limas. ? Alimentos que [employer]. Estos incluyen salsa roja, [place], salsa picante y pizza con salsa de [place]. ? Alimentos fritos y Radio broadcast assistant. Estos incluyen donas, papas fritas, papitas fritas de bolsa y aderezos con alto contenido de [place]. ? Carnes con alto contenido de [place]. Estas incluye los perros calientes, chuletas o costillas, embutidos, jamn y tocino. ? Productos lcteos ricos en grasas, como leche [place], [place] y [place] crema.  Consuma pequeas cantidades de comida con ms frecuencia. Evite consumir porciones abundantes.  Evite beber grandes cantidades de lquidos con las comidas.  Evite comer 2 o 3horas antes de acostarse.  Evite recostarse inmediatamente despus de comer.  No haga ejercicios enseguida despus de comer.   Estilo de vida  No fume ni consuma  ningn producto que contenga nicotina o tabaco. Si necesita ayuda para dejar de consumir estos productos, consulte al [employer].  Intente reducir el nivel de estrs. Si necesita ayuda para hacer esto, consulte al mdico.  Si tiene sobrepeso, baje una cantidad de  peso saludable para usted. Consulte a su mdico para bajar de peso de [employer].   Instrucciones generales  Est atento a cualquier cambio en los sntomas.  Tome los medicamentos de venta libre y los recetados solamente como se lo haya indicado el mdico.  No tome aspirina, ibuprofeno ni otros antiinflamatorios no esteroideos (AINE) a menos que el mdico lo autorice.  Use ropa holgada. No use nada apretado alrededor de la cintura.  Levante (eleve) la cabecera de la cama aproximadamente 6pulgadas (15cm). Para hacerlo, es posible que tenga que utilizar una cua.  Evite inclinarse si al hacerlo empeoran los sntomas.  Cumpla con todas las visitas de seguimiento. Comunquese con un mdico si:  Aparecen nuevos sntomas.  Adelgaza y no sabe por qu.  Tiene problemas para tragar o le duele cuando traga.  Tiene sibilancias o tos persistente.  Tiene la voz ronca.  Los sntomas no mejoran con Dispensing optician. Solicite ayuda de inmediato si:  Tree surgeon repentino [employer], el cuello, la [place], los dientes o la espalda.  De repente se siente transpirado, mareado o aturdido.  Siente falta de aire o Tourist information centre manager.  Vomita y el vmito es de color verde, amarillo o negro, o tiene un aspecto similar a la sangre o a los posos de caf.  Se desmaya.  Las heces (deposiciones) son rojas, sanguinolentas o negras.  No puede tragar, beber o comer. Estos sntomas pueden representar un problema grave que constituye Engineer, maintenance (IT). No espere a ver si los sntomas desaparecen. Solicite atencin mdica de inmediato. Comunquese con el servicio de emergencias de su localidad (911 en los [place]). No conduzca por sus propios medios Principal Financial. Resumen  Si una persona tiene enfermedad de reflujo gastroesofgico (ERGE), los alimentos y el cido estomacal suben al esfago y causan sntomas o problemas tales como dao en el esfago.  El tratamiento depender de la  gravedad de los sntomas.  Siga una dieta como se lo haya indicado el mdico.  Use todos los medicamentos solamente como se lo haya indicado el mdico. Esta informacin no tiene Marine scientist el consejo del mdico. Asegrese de hacerle al mdico cualquier pregunta que tenga. Document Revised: 02/14/2020 Document Reviewed: 02/14/2020 Elsevier Patient Education  [place].

## 2020-09-17 ENCOUNTER — Other Ambulatory Visit: Payer: Self-pay

## 2020-09-17 ENCOUNTER — Other Ambulatory Visit
Admission: RE | Admit: 2020-09-17 | Discharge: 2020-09-17 | Disposition: A | Discharge status: Home / Self Care | Payer: 59 | Source: Ambulatory Visit | Attending: Surgery | Admitting: Surgery

## 2020-09-17 NOTE — Patient Instructions (Addendum)
Your procedure is scheduled on: Thursday September 24, 2020. Su procedimiento est programado para: Jueves [place] 2022. Report to Day Surgery inside [place] 2nd floor (stop by Admissions desk first before getting on Elevator). Presntese a: Science writer del [facility] 2ndo piso (registrese primero en Admisiones antes de subir al [employer]). To find out your arrival time please call [phone] between 1PM - 3PM on Wednesday September 23, 2020. Para saber su hora de llegada por favor llame al [phone] Lyndal Pulley la 1PM - 3PM el da: Miercoles [place] 2022.  Remember: Instructions that are not followed completely may result in serious medical risk, up to and including death,  or upon the discretion of your surgeon and anesthesiologist your surgery may need to be rescheduled.  Recuerde: Las instrucciones que no se siguen completamente Heritage manager en un riesgo de salud grave, incluyendo hasta  la [place] o a discrecin de su cirujano y Environmental health practitioner, su ciruga se puede posponer.   __X_ 1.Do not eat food after midnight the night before your procedure. No    gum chewing or hard candies. You may drink clear liquids up to 2 hours     before you are scheduled to arrive for your surgery- DO not drink clear     Liquids within 2 hours of the start of your surgery.     Clear Liquids include:    water, apple juice without pulp, clear carbohydrate drink such as    Clearfast of [place]ade, Black Coffee or Tea (Do not add anything to coffee or tea).      No coma nada despus de la medianoche de la noche anterior a su    procedimiento. No coma chicles ni caramelos duros. Puede tomar    lquidos claros hasta 2 horas antes de su hora programada de llegada al     hospital para su procedimiento. No tome lquidos claros durante el     transcurso de las 2 horas de su llegada programada al hospital para su     procedimiento, ya que esto puede llevar a que su procedimiento se     retrase o tenga que volver a Health and safety inspector.  Los lquidos claros incluyen:          - Agua o jugo de [place] sin pulpa          - Bebidas claras con carbohidratos como ClearFast o Gatorade          - Caf negro o t claro (sin leche, sin cremas, no agregue nada al caf ni al t)  No tome nada que no est en esta lista.  Los pacientes con diabetes tipo 1 y tipo 2 solo deben Agricultural engineer.  Llame a la clnica de PreCare o a la unidad de Same Day Surgery si  tiene alguna pregunta sobre estas instrucciones.              _X__ 2.Do Not Smoke or use e-cigarettes For 24 Hours Prior to Your Surgery.    Do not use any chewable tobacco products for at least 6   hours prior to surgery.    No fume ni use cigarrillos electrnicos durante las 24 horas previas    a su [place].  No use ningn producto de tabaco masticable durante   al menos 6 horas antes de la [place].     __X_ 3. No alcohol for 24 hours before or after surgery.    No tome alcohol durante las 24 horas  antes ni despus de la ciruga.   __X__4. On the morning of surgery brush your teeth with toothpaste and water, you                may rinse your mouth with mouthwash if you wish.  Do not swallow any toothpaste of mouthwash.   En la maana de la [place], cepllese los dientes con pasta de dientes y [place],                [place] enjuagarse la boca con enjuague bucal si lo desea. No ingiera ninguna pasta de dientes o enjuague bucal.   __X__ 5. Notify your doctor if there is any change in your medical condition (cold,fever, infections).    Informe a su mdico si hay algn cambio en su condicin mdica  (resfriado, fiebre, infecciones).   Do not wear jewelry, make-up, hairpins, clips or nail polish.  No use joyas, maquillajes, pinzas/ganchos para el cabello ni esmalte de uas.  Do not wear lotions, powders, or perfumes. You may wear deodorant.  No use lociones, polvos o perfumes.  Puede usar desodorante.    Do not shave 48 hours prior to  surgery. Men may shave face and neck.  No se afeite 48 horas antes de la [place].  Los hombres pueden [employer] cara  y el cuello.   Do not bring valuables to the hospital.   No lleve objetos [facility] is not responsible for any belongings or valuables.  [place] no se hace responsable de ningn tipo de pertenencias u objetos de Geographical information systems officer.               Contacts, dentures or bridgework may not be worn into surgery.  Los lentes de [place], las dentaduras postizas o puentes no se pueden usar en la [place].   Leave your suitcase in the car. After surgery it may be brought to your room.  Deje su maleta en el auto.  Despus de la ciruga podr traerla a su habitacin.   For patients admitted to the hospital, discharge time is determined by your  treatment team.  Para los pacientes que sean ingresados al hospital, el tiempo en el cual se le  dar de alta es determinado por su equipo de [place].   Patients discharged the day of surgery will not be allowed to drive home. A los pacientes que se les da de alta el mismo da de la ciruga no se les permitir conducir a Holiday representative.    __X__ Take these medicines the morning of surgery with A SIP OF WATER:          [employer] estas medicinas la maana de la ciruga con UN SORBO DE AGUA:  1. omeprazole (PRILOSEC) 40 MG   ____ Fleet Enema (as directed)          Enema de Fleet (segn lo indicado)    __X__ Use CHG Soap as directed          Utilice el jabn de CHG segn lo indicado  ____ Use inhalers on the day of surgery          Use los inhaladores el da de la ciruga  ____ Stop metformin 2 days prior to surgery          Deje de tomar el metformin 2 das antes de la ciruga    ____ Take 1/2 of usual insulin dose the night before surgery and none on the morning of surgery  Tome la mitad de la dosis habitual de insulina la noche antes de la [place] y no tome nada en la maana de la             ciruga  __X__ Stop  Anti-inflammatories such           Deje de tomar Regulatory affairs officer:   __x__ Stop supplements until after surgery            Deje de tomar suplementos hasta despus de la ciruga  __X__ Do not start any herbal supplements before your procedure.   No empiece a tomar suplementos de hierbas antes de su cirugia.

## 2020-09-22 ENCOUNTER — Other Ambulatory Visit
Admission: RE | Admit: 2020-09-22 | Discharge: 2020-09-22 | Disposition: A | Discharge status: Home / Self Care | Payer: 59 | Source: Ambulatory Visit | Attending: Surgery | Admitting: Surgery

## 2020-09-22 NOTE — Pre-Procedure Instructions (Signed)
Contacted patient via phone to remind her of her scheduled covid testing for today.  She states that the interpreter told her to come between 9 and 5pm.  I clarified the time schedule for her and she will be here first thing in the morning.

## 2020-09-23 ENCOUNTER — Other Ambulatory Visit: Payer: Self-pay

## 2020-09-23 ENCOUNTER — Other Ambulatory Visit
Admission: RE | Admit: 2020-09-23 | Discharge: 2020-09-23 | Disposition: A | Discharge status: Home / Self Care | Payer: 59 | Source: Ambulatory Visit | Attending: Surgery | Admitting: Surgery

## 2020-09-23 DIAGNOSIS — Z01812 Encounter for preprocedural laboratory examination: Secondary | ICD-10-CM | POA: Diagnosis present

## 2020-09-23 DIAGNOSIS — Z20822 Contact with and (suspected) exposure to covid-19: Secondary | ICD-10-CM | POA: Diagnosis not present

## 2020-09-23 LAB — SARS CORONAVIRUS 2 (TAT 6-24 HRS): SARS Coronavirus 2: NEGATIVE

## 2020-09-24 ENCOUNTER — Other Ambulatory Visit: Payer: Self-pay

## 2020-09-24 ENCOUNTER — Ambulatory Visit
Admission: RE | Admit: 2020-09-24 | Discharge: 2020-09-24 | Disposition: A | Discharge status: Home / Self Care | Payer: 59 | Attending: Surgery | Admitting: Surgery

## 2020-09-24 ENCOUNTER — Ambulatory Visit: Payer: 59 | Admitting: Anesthesiology

## 2020-09-24 ENCOUNTER — Encounter: Payer: Self-pay | Admitting: Surgery

## 2020-09-24 ENCOUNTER — Encounter
Admission: RE | Disposition: A | Discharge status: Home / Self Care | Payer: Self-pay | Source: Home / Self Care | Attending: Surgery

## 2020-09-24 DIAGNOSIS — K432 Incisional hernia without obstruction or gangrene: Secondary | ICD-10-CM | POA: Diagnosis present

## 2020-09-24 DIAGNOSIS — K219 Gastro-esophageal reflux disease without esophagitis: Secondary | ICD-10-CM | POA: Insufficient documentation

## 2020-09-24 HISTORY — PX: INCISIONAL HERNIA REPAIR: SHX193

## 2020-09-24 LAB — POCT PREGNANCY, URINE: Preg Test, Ur: NEGATIVE

## 2020-09-24 SURGERY — REPAIR, HERNIA, INCISIONAL
Anesthesia: General

## 2020-09-24 MED ORDER — PROPOFOL 500 MG/50ML IV EMUL
INTRAVENOUS | Status: AC
  Filled 2020-09-24: qty 50

## 2020-09-24 MED ORDER — BUPIVACAINE LIPOSOME 1.3 % IJ SUSP
INTRAMUSCULAR | Status: DC | PRN
  Administered 2020-09-24: 20 mL

## 2020-09-24 MED ORDER — GLYCOPYRROLATE 0.2 MG/ML IJ SOLN
INTRAMUSCULAR | Status: DC | PRN
  Administered 2020-09-24: .2 mg via INTRAVENOUS

## 2020-09-24 MED ORDER — BUPIVACAINE-EPINEPHRINE (PF) 0.25% -1:200000 IJ SOLN
INTRAMUSCULAR | Status: DC | PRN
  Administered 2020-09-24: 30 mL

## 2020-09-24 MED ORDER — CELECOXIB 200 MG PO CAPS
ORAL_CAPSULE | ORAL | Status: AC
  Administered 2020-09-24: 200 mg via ORAL
  Filled 2020-09-24: qty 1

## 2020-09-24 MED ORDER — FENTANYL CITRATE (PF) 100 MCG/2ML IJ SOLN
25.0000 ug | INTRAMUSCULAR | Status: DC | PRN
  Administered 2020-09-24: 50 ug via INTRAVENOUS
  Administered 2020-09-24: 25 ug via INTRAVENOUS

## 2020-09-24 MED ORDER — PROMETHAZINE HCL 25 MG/ML IJ SOLN: 6.2500 mg | INTRAMUSCULAR | Status: DC | PRN

## 2020-09-24 MED ORDER — GLYCOPYRROLATE 0.2 MG/ML IJ SOLN
INTRAMUSCULAR | Status: AC
  Filled 2020-09-24: qty 1

## 2020-09-24 MED ORDER — FENTANYL CITRATE (PF) 100 MCG/2ML IJ SOLN
INTRAMUSCULAR | Status: DC | PRN
  Administered 2020-09-24 (×2): 50 ug via INTRAVENOUS

## 2020-09-24 MED ORDER — KETOROLAC TROMETHAMINE 30 MG/ML IJ SOLN: 30.0000 mg | Freq: Once | INTRAMUSCULAR | Status: DC | PRN

## 2020-09-24 MED ORDER — ROCURONIUM BROMIDE 10 MG/ML (PF) SYRINGE
PREFILLED_SYRINGE | INTRAVENOUS | Status: AC
  Filled 2020-09-24: qty 10

## 2020-09-24 MED ORDER — LACTATED RINGERS IV SOLN: INTRAVENOUS | Status: DC

## 2020-09-24 MED ORDER — CEFAZOLIN SODIUM-DEXTROSE 2-4 GM/100ML-% IV SOLN
2.0000 g | INTRAVENOUS | Status: AC
  Administered 2020-09-24: 2 g via INTRAVENOUS

## 2020-09-24 MED ORDER — CHLORHEXIDINE GLUCONATE 0.12 % MT SOLN: 15.0000 mL | Freq: Once | OROMUCOSAL | Status: AC

## 2020-09-24 MED ORDER — HYDROCODONE-ACETAMINOPHEN 7.5-325 MG PO TABS
1.0000 | ORAL_TABLET | Freq: Once | ORAL | Status: AC | PRN
  Administered 2020-09-24: 1 via ORAL
  Filled 2020-09-24: qty 1

## 2020-09-24 MED ORDER — HYDROCODONE-ACETAMINOPHEN 5-325 MG PO TABS: 1.0000 | ORAL_TABLET | ORAL | 0 refills | Status: DC | PRN

## 2020-09-24 MED ORDER — GABAPENTIN 300 MG PO CAPS: 300.0000 mg | ORAL_CAPSULE | ORAL | Status: AC

## 2020-09-24 MED ORDER — CEFAZOLIN SODIUM-DEXTROSE 2-4 GM/100ML-% IV SOLN
INTRAVENOUS | Status: AC
  Filled 2020-09-24: qty 100

## 2020-09-24 MED ORDER — BUPIVACAINE LIPOSOME 1.3 % IJ SUSP
INTRAMUSCULAR | Status: AC
  Filled 2020-09-24: qty 20

## 2020-09-24 MED ORDER — ACETAMINOPHEN 325 MG PO TABS
325.0000 mg | ORAL_TABLET | ORAL | Status: DC | PRN
Start: 2020-09-24 — End: 2020-09-24

## 2020-09-24 MED ORDER — SUCCINYLCHOLINE CHLORIDE 20 MG/ML IJ SOLN
INTRAMUSCULAR | Status: DC | PRN
  Administered 2020-09-24: 120 mg via INTRAVENOUS

## 2020-09-24 MED ORDER — ACETAMINOPHEN 500 MG PO TABS
ORAL_TABLET | ORAL | Status: AC
  Administered 2020-09-24: 1000 mg via ORAL
  Filled 2020-09-24: qty 2

## 2020-09-24 MED ORDER — DEXAMETHASONE SODIUM PHOSPHATE 10 MG/ML IJ SOLN
INTRAMUSCULAR | Status: AC
  Filled 2020-09-24: qty 1

## 2020-09-24 MED ORDER — PROPOFOL 10 MG/ML IV BOLUS
INTRAVENOUS | Status: AC
  Filled 2020-09-24: qty 40

## 2020-09-24 MED ORDER — LIDOCAINE HCL (PF) 2 % IJ SOLN
INTRAMUSCULAR | Status: AC
  Filled 2020-09-24: qty 5

## 2020-09-24 MED ORDER — ACETAMINOPHEN 500 MG PO TABS: 1000.0000 mg | ORAL_TABLET | ORAL | Status: AC

## 2020-09-24 MED ORDER — DEXAMETHASONE SODIUM PHOSPHATE 10 MG/ML IJ SOLN
INTRAMUSCULAR | Status: DC | PRN
  Administered 2020-09-24: 10 mg via INTRAVENOUS

## 2020-09-24 MED ORDER — MIDAZOLAM HCL 2 MG/2ML IJ SOLN
INTRAMUSCULAR | Status: AC
  Filled 2020-09-24: qty 2

## 2020-09-24 MED ORDER — ROCURONIUM BROMIDE 100 MG/10ML IV SOLN
INTRAVENOUS | Status: DC | PRN
  Administered 2020-09-24: 15 mg via INTRAVENOUS
  Administered 2020-09-24: 25 mg via INTRAVENOUS

## 2020-09-24 MED ORDER — FENTANYL CITRATE (PF) 100 MCG/2ML IJ SOLN
INTRAMUSCULAR | Status: AC
  Filled 2020-09-24: qty 2

## 2020-09-24 MED ORDER — FENTANYL CITRATE (PF) 100 MCG/2ML IJ SOLN
INTRAMUSCULAR | Status: AC
  Administered 2020-09-24: 25 ug via INTRAVENOUS
  Filled 2020-09-24: qty 2

## 2020-09-24 MED ORDER — CHLORHEXIDINE GLUCONATE CLOTH 2 % EX PADS: 6.0000 | MEDICATED_PAD | Freq: Once | CUTANEOUS | Status: DC

## 2020-09-24 MED ORDER — GABAPENTIN 300 MG PO CAPS
ORAL_CAPSULE | ORAL | Status: AC
  Administered 2020-09-24: 300 mg via ORAL
  Filled 2020-09-24: qty 1

## 2020-09-24 MED ORDER — LIDOCAINE HCL (CARDIAC) PF 100 MG/5ML IV SOSY
PREFILLED_SYRINGE | INTRAVENOUS | Status: DC | PRN
  Administered 2020-09-24 (×2): 50 mg via INTRAVENOUS

## 2020-09-24 MED ORDER — CELECOXIB 200 MG PO CAPS: 200.0000 mg | ORAL_CAPSULE | ORAL | Status: AC

## 2020-09-24 MED ORDER — MIDAZOLAM HCL 2 MG/2ML IJ SOLN
INTRAMUSCULAR | Status: DC | PRN
  Administered 2020-09-24: 2 mg via INTRAVENOUS

## 2020-09-24 MED ORDER — SUGAMMADEX SODIUM 200 MG/2ML IV SOLN
INTRAVENOUS | Status: DC | PRN
  Administered 2020-09-24: 200 mg via INTRAVENOUS

## 2020-09-24 MED ORDER — ONDANSETRON HCL 4 MG/2ML IJ SOLN
INTRAMUSCULAR | Status: DC | PRN
  Administered 2020-09-24: 4 mg via INTRAVENOUS

## 2020-09-24 MED ORDER — HYDROCODONE-ACETAMINOPHEN 7.5-325 MG PO TABS
ORAL_TABLET | ORAL | Status: AC
  Filled 2020-09-24: qty 1

## 2020-09-24 MED ORDER — CHLORHEXIDINE GLUCONATE 0.12 % MT SOLN
OROMUCOSAL | Status: AC
  Administered 2020-09-24: 15 mL via OROMUCOSAL
  Filled 2020-09-24: qty 15

## 2020-09-24 MED ORDER — PROPOFOL 10 MG/ML IV BOLUS
INTRAVENOUS | Status: DC | PRN
  Administered 2020-09-24: 150 mg via INTRAVENOUS
  Administered 2020-09-24: 50 mg via INTRAVENOUS

## 2020-09-24 MED ORDER — ACETAMINOPHEN 160 MG/5ML PO SOLN
325.0000 mg | ORAL | Status: DC | PRN
  Filled 2020-09-24: qty 20.3

## 2020-09-24 MED ORDER — ONDANSETRON HCL 4 MG/2ML IJ SOLN
INTRAMUSCULAR | Status: AC
  Filled 2020-09-24: qty 2

## 2020-09-24 MED ORDER — DROPERIDOL 2.5 MG/ML IJ SOLN
0.6250 mg | Freq: Once | INTRAMUSCULAR | Status: DC | PRN
  Filled 2020-09-24: qty 2

## 2020-09-24 MED ORDER — BUPIVACAINE-EPINEPHRINE (PF) 0.25% -1:200000 IJ SOLN
INTRAMUSCULAR | Status: AC
  Filled 2020-09-24: qty 30

## 2020-09-24 MED ORDER — ORAL CARE MOUTH RINSE: 15.0000 mL | Freq: Once | OROMUCOSAL | Status: AC

## 2020-09-24 SURGICAL SUPPLY — 34 items
ADH SKN CLS APL DERMABOND .7 (GAUZE/BANDAGES/DRESSINGS) ×1
APL PRP STRL LF DISP 70% ISPRP (MISCELLANEOUS) ×1
APPLIER CLIP 11 MED OPEN (CLIP)
APR CLP MED 11 20 MLT OPN (CLIP)
BLADE CLIPPER SURG (BLADE) IMPLANT
BLADE SURG 15 STRL LF DISP TIS (BLADE) ×1 IMPLANT
BLADE SURG 15 STRL SS (BLADE) ×2
CANISTER SUCT 1200ML W/VALVE (MISCELLANEOUS) IMPLANT
CHLORAPREP W/TINT 26 (MISCELLANEOUS) ×2 IMPLANT
CLIP APPLIE 11 MED OPEN (CLIP) IMPLANT
COVER WAND RF STERILE (DRAPES) ×2 IMPLANT
DERMABOND ADVANCED (GAUZE/BANDAGES/DRESSINGS) ×1
DERMABOND ADVANCED .7 DNX12 (GAUZE/BANDAGES/DRESSINGS) ×1 IMPLANT
DRAPE INCISE IOBAN 66X45 STRL (DRAPES) IMPLANT
DRAPE LAPAROTOMY 77X122 PED (DRAPES) ×2 IMPLANT
DRSG TELFA 3X8 NADH (GAUZE/BANDAGES/DRESSINGS) IMPLANT
ELECT CAUTERY BLADE 6.4 (BLADE) ×2 IMPLANT
ELECT REM PT RETURN 9FT ADLT (ELECTROSURGICAL) ×2
ELECTRODE REM PT RTRN 9FT ADLT (ELECTROSURGICAL) ×1 IMPLANT
GLOVE SURG ENC MOIS LTX SZ7 (GLOVE) ×8 IMPLANT
GOWN STRL REUS W/ TWL LRG LVL3 (GOWN DISPOSABLE) ×2 IMPLANT
GOWN STRL REUS W/TWL LRG LVL3 (GOWN DISPOSABLE) ×4
HANDLE YANKAUER SUCT BULB TIP (MISCELLANEOUS) ×2 IMPLANT
MANIFOLD NEPTUNE II (INSTRUMENTS) ×2 IMPLANT
MESH VENTRALEX ST 2.5 CRC MED (Mesh General) ×2 IMPLANT
NEEDLE HYPO 22GX1.5 SAFETY (NEEDLE) ×2 IMPLANT
NS IRRIG 500ML POUR BTL (IV SOLUTION) ×2 IMPLANT
PACK BASIN MINOR ARMC (MISCELLANEOUS) ×2 IMPLANT
SPONGE LAP 18X18 RF (DISPOSABLE) ×2 IMPLANT
SUT ETHIBOND NAB MO 7 #0 18IN (SUTURE) ×4 IMPLANT
SUT MNCRL AB 4-0 PS2 18 (SUTURE) ×2 IMPLANT
SUT VIC AB 3-0 SH 27 (SUTURE) ×2
SUT VIC AB 3-0 SH 27X BRD (SUTURE) ×1 IMPLANT
SYR 20ML LL LF (SYRINGE) ×2 IMPLANT

## 2020-09-24 NOTE — Anesthesia Postprocedure Evaluation (Signed)
Anesthesia Post Note  Patient: Wanda Powers  Procedure(s) Performed: HERNIA REPAIR INCISIONAL umbilical (N/A )  Patient location during evaluation: PACU Anesthesia Type: General Level of consciousness: awake and alert Pain management: pain level controlled Vital Signs Assessment: post-procedure vital signs reviewed and stable Respiratory status: spontaneous breathing, nonlabored ventilation and respiratory function stable Cardiovascular status: blood pressure returned to baseline and stable Postop Assessment: no apparent nausea or vomiting Anesthetic complications: no   No complications documented.   Last Vitals:  Vitals:   09/24/20 1725 09/24/20 1758  BP: 111/68 113/67  Pulse: 84 78  Resp: 16 18  Temp: (!) 36.1 C   SpO2: 97% 98%    Last Pain:  Vitals:   09/24/20 1758  TempSrc:   PainSc: [place]

## 2020-09-24 NOTE — Transfer of Care (Signed)
Immediate Anesthesia Transfer of Care Note  Patient: Wanda Powers  Procedure(s) Performed: HERNIA REPAIR INCISIONAL umbilical (N/A )  Patient Location: PACU  Anesthesia Type:General  Level of Consciousness: sedated  Airway & Oxygen Therapy: Patient Spontanous Breathing and Patient connected to face mask oxygen  Post-op Assessment: Report given to RN and Post -op Vital signs reviewed and stable  Post vital signs: Reviewed  Last Vitals:  Vitals Value Taken Time  BP 118/77 09/24/20 1622  Temp    Pulse 83 09/24/20 1622  Resp 22 09/24/20 1622  SpO2 100 % 09/24/20 1622  Vitals shown include unvalidated device data.  Last Pain:  Vitals:   09/24/20 1400  TempSrc: Tympanic  PainSc: 1          Complications: No complications documented.

## 2020-09-24 NOTE — Anesthesia Preprocedure Evaluation (Signed)
Anesthesia Evaluation  Patient identified by MRN, date of birth, ID band Patient awake    Reviewed: Allergy & Precautions, H&P , NPO status , reviewed documented beta blocker date and time   Airway Mallampati: II  TM Distance: >3 FB Neck ROM: full    Dental  (+) Partial Upper, Partial Lower, Caps   Pulmonary    Pulmonary exam normal        Cardiovascular Normal cardiovascular exam     Neuro/Psych  Headaches,    GI/Hepatic GERD  Medicated and Controlled,On Rx for GERD x 5 days, states much improved. Will use RSI/ETT as a precaution   Endo/Other  diabetes  Renal/GU      Musculoskeletal   Abdominal   Peds  Hematology   Anesthesia Other Findings Past Medical History: 10/17/2014: Adenomyoma of gallbladder 01/20/1606: Biliary colic 09/21/1060: Chronic cholecystitis 10/17/2014: Common bile duct dilation 2013: Gestational diabetes No date: Headache 1996: Pyelonephritis Past Surgical History: 2004: CESAREAN SECTION     Comment:  x 1 No date: CHOLECYSTECTOMY 12/25/2014: LAPAROSCOPIC CHOLECYSTECTOMY SINGLE SITE WITH  INTRAOPERATIVE CHOLANGIOGRAM; N/A     Comment:  Procedure: LAPAROSCOPIC CHOLECYSTECTOMY SINGLE SITE WITH              INTRAOPERATIVE CHOLANGIOGRAM;  Surgeon: Michael Boston, MD;              Location: [facility];  Service: General;  Laterality: N/A; BMI    Body Mass Index: 27.46 kg/m     Reproductive/Obstetrics                             Anesthesia Physical Anesthesia Plan  ASA: II  Anesthesia Plan: General   Post-op Pain Management:    Induction: Intravenous and Rapid sequence  PONV Risk Score and Plan: Ondansetron, Midazolam and Treatment may vary due to age or medical condition  Airway Management Planned: Oral ETT  Additional Equipment:   Intra-op Plan:   Post-operative Plan: Extubation in OR  Informed Consent: I have reviewed the patients History and Physical, chart, labs  and discussed the procedure including the risks, benefits and alternatives for the proposed anesthesia with the patient or authorized representative who has indicated his/her understanding and acceptance.     Dental Advisory Given  Plan Discussed with: CRNA  Anesthesia Plan Comments:         Anesthesia Quick Evaluation

## 2020-09-24 NOTE — Discharge Instructions (Addendum)
Reparacin abierta de una hernia, en adultos Open Hernia Repair, Adult La reparacin abierta de una hernia es un procedimiento quirrgico que se realiza para arreglar una hernia. Una hernia ocurre cuando un rgano o tejido interno protruye a travs de un punto dbil de los msculos que conforman la pared del abdomen. Las hernias se producen con mayor frecuencia en la ingle y alrededor del ombligo. En su mayora, las hernias suelen empeorar con el tiempo. Con frecuencia, se realiza una ciruga para evitar que la hernia se agrande, genere molestias o por Engineer, maintenance (IT). Es posible que sea necesaria una ciruga de emergencia si el contenido del abdomen queda atascado en la abertura (hernia encarcelada) o se interrumpe el suministro de sangre (hernia estrangulada). En una reparacin abierta, la incisin se realiza en el abdomen para Charity fundraiser. Informe al mdico acerca de lo siguiente:  Cualquier alergia que tenga.  Todos los [employer] [place], incluidos vitaminas, hierbas, gotas oftlmicas, cremas y medicamentos de venta libre.  Problemas previos que usted o algn miembro de su familia hayan tenido con los anestsicos.  Enfermedades de la sangre o los huesos que tenga.  Cirugas a las que se haya sometido.  Cualquier afeccin mdica que tenga, incluido cualquier sntoma reciente de resfro o gripe (influenza).  Si est embarazada o podra estarlo. Cules son los riesgos? En general, se trata de un procedimiento seguro. Sin embargo, pueden ocurrir complicaciones, por ejemplo:  Dolor por Management consultant (crnico).  Sangrado.  Infeccin.  [employer]. Esto puede provocar atrofia e hinchazn.  Daos a las estructuras u rganos cercanos, incluidos la vejiga, los vasos sanguneos, los intestinos o los nervios prximos a la hernia.  Cogulos de [place].  Dificultad para orinar.  Reaparicin de la hernia. Qu ocurre antes del procedimiento? Mantenerse hidratado Siga  las instrucciones del mdico acerca de mantenerse hidratado, las cuales pueden incluir lo siguiente:  [employer] horas antes del procedimiento, puede beber lquidos transparentes, como agua, jugos de fruta sin pulpa, caf negro y t solo.   Restricciones en las comidas y bebidas Siga las instrucciones del mdico respecto de las restricciones de comidas o bebidas, las cuales pueden incluir lo siguiente:  Ocho horas antes del procedimiento, deje de ingerir comidas o alimentos pesados, como carne, alimentos fritos o alimentos grasos.  Seis horas antes del procedimiento, deje de ingerir comidas o alimentos livianos, como tostadas o cereales.  Seis horas antes del procedimiento, deje de beber [place] o bebidas que [employer].  Dos horas antes del procedimiento, deje de beber lquidos transparentes. Medicamentos Consulte al mdico sobre:  Quarry manager o suspender los medicamentos que [place] habitualmente. Esto es muy importante si toma medicamentos para la diabetes o anticoagulantes.  Tomar medicamentos como aspirina e ibuprofeno. Estos medicamentos pueden tener un efecto anticoagulante en la [place]. No tome estos medicamentos a menos que el mdico se lo indique.  Usar medicamentos de venta libre, vitaminas, hierbas y suplementos. Seguridad en la ciruga Pregntele al mdico:  Cmo se Tax inspector de la Leisure centre manager.  Qu medidas se tomarn para evitar una infeccin. Estas medidas pueden incluir las siguientes: ? Rasurar el vello del lugar de la ciruga. ? Lavar la piel con un jabn antisptico. ? Recibir antibiticos. Instrucciones generales  Es posible que le hagan un examen o anlisis, como anlisis de [place] o estudios de diagnstico por imgenes.  No consuma ningn producto que contenga nicotina ni tabaco durante al [employer] las 4 semanas anteriores al procedimiento. Estos productos incluyen cigarrillos, tabaco  para Higher education careers adviser y aparatos de vapeo, como los Psychologist, sport and exercise. Si necesita  ayuda para dejar de fumar, consulte al mdico.  Infrmele al mdico antes de la ciruga si se ha resfriado o si tiene una infeccin. Si tiene una infeccin antes de la [place], es posible que le administren antibiticos para tratarla.  Haga que un adulto responsable lo lleve a su casa desde el hospital o la clnica.  Si va a marcharse a su casa inmediatamente despus del procedimiento, pdale a un adulto responsable que lo cuide durante el tiempo que le indiquen. Esto es importante. Qu ocurre durante el procedimiento?  Le colocarn una va intravenosa (IV) en una vena.  Le administrarn uno o ms de los siguientes medicamentos: ? Un medicamento para ayudar a relajarse (sedante). ? Un medicamento para adormecer la zona (anestesia local). ? Un medicamento que lo har dormir (anestesia general).  Su cirujano le realizar una incisin en la hernia.  Los tejidos de la hernia se colocarn nuevamente en su posicin normal.  Los bordes de la hernia se pueden coser (suturar) juntos.  La abertura de los msculos abdominales se cerrar con puntos (suturas). O el cirujano puede colocar un parche de [place] de material artificial (sinttico) sobre la abertura.  La incisin se cerrar con suturas, goma para cerrar la piel o tiras [id number].  Se puede colocar una venda (vendaje) sobre la incisin. El procedimiento puede variar segn el mdico y el hospital.   Sander Nephew ocurre despus del procedimiento?  Le controlarn la presin arterial, la frecuencia cardaca, la frecuencia respiratoria y Retail buyer de oxgeno en la sangre hasta que le den el alta del hospital o la clnica.  Pueden administrarle analgsicos.  Si le administraron un sedante durante el procedimiento, puede afectarlo por varias horas. No conduzca ni opere maquinaria hasta que el mdico le indique que es seguro [place]. Resumen  La reparacin abierta de una hernia es un procedimiento quirrgico que se realiza para arreglar una hernia.  Las hernias se producen con mayor frecuencia en la ingle y alrededor del ombligo.  Es posible que sea necesaria una ciruga de emergencia si el contenido del abdomen queda atascado en la abertura (hernia encarcelada) o se interrumpe el suministro de sangre (hernia estrangulada).  En este procedimiento, se hace una incisin en el abdomen para realizar la [place].  Despus del procedimiento, es posible que le den medicamentos para Best boy. Esta informacin no tiene Marine scientist el consejo del mdico. Asegrese de hacerle al mdico cualquier pregunta que tenga. Document Revised: 04/24/2020 Document Reviewed: 04/24/2020 Elsevier Patient Education  2021 [place]   1) The drugs that you were given will stay in your system until tomorrow so for the next 24 hours you should not:  A) Drive an automobile B) Make any legal decisions C) Drink any alcoholic beverage   2) You may resume regular meals tomorrow.  Today it is better to start with liquids and gradually work up to solid foods.  You may eat anything you prefer, but it is better to start with liquids, then soup and crackers, and gradually work up to solid foods.   3) Please notify your doctor immediately if you have any unusual bleeding, trouble breathing, redness and pain at the surgery site, drainage, fever, or pain not relieved by medication.    4) Additional Instructions:        Please contact your physician with any problems or Same Day Surgery at [phone],  Monday through Friday 6 am to 4 pm, or [place] at [facility] number at [phone].

## 2020-09-24 NOTE — Op Note (Signed)
Incisional Hernia Repair with Mesh using 6.4 cms Round  Pre-operative Diagnosis: Incisional hernia  Post-operative Diagnosis: same  Surgeon: Caroleen Hamman, MD FACS  Anesthesia: Gen. with endotracheal tube  Findings: 2 cm incisional hernia  Estimated Blood Loss: 5cc         Specimens: sac          Complications: none              Procedure Details  The patient was seen again in the Holding Room. The benefits, complications, treatment options, and expected outcomes were discussed with the patient. The risks of bleeding, infection, recurrence of symptoms, failure to resolve symptoms, bowel injury, mesh placement, mesh infection, any of which could require further surgery were reviewed with the patient. The likelihood of improving the patient's symptoms with return to their baseline status is good.  The patient and/or family concurred with the proposed plan, giving informed consent.  The patient was taken to Operating Room, identified and the procedure verified.  A Time Out was held and the above information confirmed.  Prior to the induction of general anesthesia, antibiotic prophylaxis was administered. VTE prophylaxis was in place. General endotracheal anesthesia was then administered and tolerated well. After the induction, the abdomen was prepped with Chloraprep and draped in the sterile fashion. The patient was positioned in the supine position.   Incision was created with a scalpel over the hernia defect. Electrocautery was used to dissect through subcutaneous tissue, the hernia sac was opened excised.  The hernia was measured. A BARD 6.4cms Ventralex  patch selected and secured to the fascia using 4 corner 0 ethibond stitches. I closed the hernia defect with interrupted 0 Ethibond sutures.   Incision was closed in a 2 layer fashion with 3-0 Vicryl and 4-0 Monocryl. Dermabond was used to coat the skin. Liposomal Marcaine  was used to inject at the incision site. Patient tolerated  procedure well and there were no immediate complications. Needle and laparotomy counts were correct

## 2020-09-24 NOTE — Interval H&P Note (Signed)
History and Physical Interval Note:  09/24/2020 6:94 PM  Wanda Powers  has presented today for surgery, with the diagnosis of incisional hernia.  The various methods of treatment have been discussed with the patient and family. After consideration of risks, benefits and other options for treatment, the patient has consented to  Procedure(s): HERNIA REPAIR INCISIONAL umbilical (N/A) as a surgical intervention.  The patient's history has been reviewed, patient examined, no change in status, stable for surgery.  I have reviewed the patient's chart and labs.  Questions were answered to the patient's satisfaction.     [place]

## 2020-09-24 NOTE — Anesthesia Procedure Notes (Signed)
Procedure Name: Intubation Performed by: Rolla Plate, CRNA Pre-anesthesia Checklist: Patient identified, Patient being monitored, Timeout performed, Emergency Drugs available and Suction available Patient Re-evaluated:Patient Re-evaluated prior to induction Oxygen Delivery Method: Circle system utilized Preoxygenation: Pre-oxygenation with 100% oxygen Induction Type: IV induction, Rapid sequence and Cricoid Pressure applied Laryngoscope Size: Miller and 2 Grade View: Grade I Tube type: Oral Tube size: 7.0 mm Number of attempts: 1 Airway Equipment and Method: Stylet Placement Confirmation: ETT inserted through vocal cords under direct vision,  positive ETCO2 and breath sounds checked- equal and bilateral Secured at: 21 cm Tube secured with: Tape Dental Injury: Teeth and Oropharynx as per pre-operative assessment

## 2020-09-25 ENCOUNTER — Encounter: Payer: Self-pay | Admitting: Surgery

## 2020-09-28 LAB — SURGICAL PATHOLOGY

## 2020-10-03 ENCOUNTER — Emergency Department (HOSPITAL_COMMUNITY)
Admission: EM | Admit: 2020-10-03 | Discharge: 2020-10-03 | Disposition: A | Discharge status: Home / Self Care | Payer: 59 | Attending: Emergency Medicine | Admitting: Emergency Medicine

## 2020-10-03 ENCOUNTER — Encounter (HOSPITAL_COMMUNITY): Payer: Self-pay | Admitting: Emergency Medicine

## 2020-10-03 DIAGNOSIS — Z5321 Procedure and treatment not carried out due to patient leaving prior to being seen by health care provider: Secondary | ICD-10-CM | POA: Diagnosis not present

## 2020-10-03 DIAGNOSIS — R1030 Lower abdominal pain, unspecified: Secondary | ICD-10-CM | POA: Insufficient documentation

## 2020-10-03 LAB — CBC
HCT: 38.1 % (ref 36.0–46.0)
Hemoglobin: 13.5 g/dL (ref 12.0–15.0)
MCH: 31.3 pg (ref 26.0–34.0)
MCHC: 35.4 g/dL (ref 30.0–36.0)
MCV: 88.4 fL (ref 80.0–100.0)
Platelets: 227 10*3/uL (ref 150–400)
RBC: 4.31 MIL/uL (ref 3.87–5.11)
RDW: 12.4 % (ref 11.5–15.5)
WBC: 5.2 10*3/uL (ref 4.0–10.5)
nRBC: 0 % (ref 0.0–0.2)

## 2020-10-03 LAB — COMPREHENSIVE METABOLIC PANEL
ALT: 29 U/L (ref 0–44)
AST: 30 U/L (ref 15–41)
Albumin: 3.9 g/dL (ref 3.5–5.0)
Alkaline Phosphatase: 47 U/L (ref 38–126)
Anion gap: 8 (ref 5–15)
BUN: 13 mg/dL (ref 6–20)
CO2: 22 mmol/L (ref 22–32)
Calcium: 8.7 mg/dL — ABNORMAL LOW (ref 8.9–10.3)
Chloride: 104 mmol/L (ref 98–111)
Creatinine, Ser: 0.72 mg/dL (ref 0.44–1.00)
GFR, Estimated: >60 mL/min (ref >60)
Glucose, Bld: 113 mg/dL — ABNORMAL HIGH (ref 70–99)
Potassium: 4 mmol/L (ref 3.5–5.1)
Sodium: 134 mmol/L — ABNORMAL LOW (ref 135–145)
Total Bilirubin: 0.7 mg/dL (ref 0.3–1.2)
Total Protein: 7.2 g/dL (ref 6.5–8.1)

## 2020-10-03 LAB — LIPASE, BLOOD: Lipase: 31 U/L (ref 11–51)

## 2020-10-03 NOTE — ED Triage Notes (Signed)
C/o lower abd pain x 3 days.  Denies nausea, vomiting, and diarrhea.  Hernia repair on 09/24/20.

## 2020-10-03 NOTE — ED Notes (Signed)
Pt name called twice for vitals check and no response

## 2020-10-03 NOTE — ED Notes (Signed)
Pt name called x3 for room with no answer.

## 2020-10-06 ENCOUNTER — Other Ambulatory Visit: Payer: Self-pay

## 2020-10-06 ENCOUNTER — Ambulatory Visit: Admission: EM | Admit: 2020-10-06 | Discharge: 2020-10-06 | Discharge status: Against Medical Advice | Payer: 59

## 2020-10-26 ENCOUNTER — Ambulatory Visit (INDEPENDENT_AMBULATORY_CARE_PROVIDER_SITE_OTHER): Payer: 59 | Admitting: Surgery

## 2020-10-26 ENCOUNTER — Encounter: Payer: Self-pay | Admitting: Surgery

## 2020-10-26 ENCOUNTER — Other Ambulatory Visit: Payer: Self-pay

## 2020-10-26 VITALS — BP 107/50 | HR 66 | Temp 98.4°F | Ht 64.0 in | Wt 160.4 lb

## 2020-10-26 DIAGNOSIS — R220 Localized swelling, mass and lump, head: Secondary | ICD-10-CM | POA: Diagnosis not present

## 2020-10-26 DIAGNOSIS — Z09 Encounter for follow-up examination after completed treatment for conditions other than malignant neoplasm: Secondary | ICD-10-CM

## 2020-10-26 NOTE — Patient Instructions (Addendum)
Follow up as scheduled  GENERAL POST-OPERATIVE PATIENT INSTRUCTIONS   WOUND CARE INSTRUCTIONS:  Keep a dry clean dressing on the wound if there is drainage. The initial bandage may be removed after 24 hours.  Once the wound has quit draining you may leave it open to air.  If clothing rubs against the wound or causes irritation and the wound is not draining you may cover it with a dry dressing during the daytime.  Try to keep the wound dry and avoid ointments on the wound unless directed to do so.  If the wound becomes bright red and painful or starts to drain infected material that is not clear, please contact your physician immediately.  If the wound is mildly pink and has a thick firm ridge underneath it, this is normal, and is referred to as a healing ridge.  This will resolve over the next 4-6 weeks.  BATHING: You may shower if you have been informed of this by your surgeon. However, Please do not submerge in a tub, hot tub, or pool until incisions are completely sealed or have been told by your surgeon that you may do so.  DIET:  You may eat any foods that you can tolerate.  It is a good idea to eat a high fiber diet and take in plenty of fluids to prevent constipation.  If you do become constipated you may want to take a mild laxative or take ducolax tablets on a daily basis until your bowel habits are regular.  Constipation can be very uncomfortable, along with straining, after recent surgery.  ACTIVITY:  You are encouraged to cough and deep breath or use your incentive spirometer if you were given one, every 15-30 minutes when awake.  This will help prevent respiratory complications and low grade fevers post-operatively if you had a general anesthetic.  You may want to hug a pillow when coughing and sneezing to add additional support to the surgical area, if you had abdominal or chest surgery, which will decrease pain during these times.  You are encouraged to walk and engage in light activity  for the next two weeks.  You should not lift more than 20 pounds, until 11/05/20 as it could put you at increased risk for complications.  Twenty pounds is roughly equivalent to a plastic bag of groceries. At that time- Listen to your body when lifting, if you have pain when lifting, stop and then try again in a few days. Soreness after doing exercises or activities of daily living is normal as you get back in to your normal routine.  MEDICATIONS:  Try to take narcotic medications and anti-inflammatory medications, such as tylenol, ibuprofen, naprosyn, etc., with food.  This will minimize stomach upset from the medication.  Should you develop nausea and vomiting from the pain medication, or develop a rash, please discontinue the medication and contact your physician.  You should not drive, make important decisions, or operate machinery when taking narcotic pain medication.  SUNBLOCK Use sun block to incision area over the next year if this area will be exposed to sun. This helps decrease scarring and will allow you avoid a permanent darkened area over your incision.  QUESTIONS:  Please feel free to call our office if you have any questions, and we will be glad to assist you.

## 2020-10-27 ENCOUNTER — Encounter: Payer: Self-pay | Admitting: Surgery

## 2020-10-27 NOTE — Progress Notes (Signed)
Outpatient Surgical Follow Up  6/55/3748  Wanda Powers is an 45 y.o. female.   Chief Complaint  Patient presents with  . Routine Post Op    Ventral hernia repair     HPI: Wanda Powers 30-year-old female few weeks out from a robotic ventral hernia repair with mesh.  She did very well.  No fevers no chills some mild soreness.  Taking p.o.  Her main concern now is to symptomatically scabbed lesions.  They seem to have grown over time and they are bothersome especially when she comes.  She denies any fevers or any chills.  Past Medical History:  Diagnosis Date  . 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 Biliary colic 08/24/784  . Chronic cholecystitis 12/25/2014  . Common bile duct dilation 10/17/2014  . Gestational diabetes 2013  . Headache   . Pyelonephritis 1996    Past Surgical History:  Procedure Laterality Date  . CESAREAN SECTION  2004   x 1  . CHOLECYSTECTOMY    . INCISIONAL HERNIA REPAIR N/A 09/24/2020   Procedure: HERNIA REPAIR INCISIONAL umbilical;  Surgeon: Jules Husbands, MD;  Location: [facility] OR[facility];  Service: General;  Laterality: N/A;  . LAPAROSCOPIC CHOLECYSTECTOMY SINGLE SITE WITH INTRAOPERATIVE CHOLANGIOGRAM N/A 12/25/2014   Procedure: LAPAROSCOPIC CHOLECYSTECTOMY SINGLE SITE WITH INTRAOPERATIVE CHOLANGIOGRAM;  Surgeon: Michael Boston, MD;  Location: [facility];  Service: General;  Laterality: N/A;    Family History  Problem Relation Age of Onset  . Hyperlipidemia Mother   . Hypertension Mother   . Cancer Mother   . Heart attack Neg Hx   . Diabetes Neg Hx   . Sudden death Neg Hx     Social History:  reports that she has never smoked. She has never used smokeless tobacco. She reports that she does not drink alcohol and does not use drugs.  Allergies: No Known Allergies  Medications reviewed.    ROS Full ROS performed and is otherwise negative other than what is stated in HPI   BP (!) 107/50   Pulse 66   Temp 98.4 F (36.9 C) (Oral)   Ht 5\' 4"  (1.626  m)   Wt 160 lb 6.4 oz (72.8 kg)   SpO2 97%   BMI 27.53 kg/m   Physical Exam Vitals and nursing note reviewed. Exam conducted with a chaperone present.  Constitutional:      Appearance: Normal appearance. She is normal weight.  Pulmonary:     Effort: Pulmonary effort is normal. No respiratory distress.     Breath sounds: No stridor.  Abdominal:     General: Abdomen is flat. There is no distension.     Palpations: Abdomen is soft. There is no mass.     Tenderness: There is no abdominal tenderness.     Hernia: No hernia is present.     Comments: incisions healing well, no recurrence  Musculoskeletal:        General: No swelling. Normal range of motion.     Cervical back: Normal range of motion and neck supple. No rigidity.  Skin:    General: Skin is warm and dry.     Capillary Refill: Capillary refill takes less than 2 seconds.     Comments: She does have two scalp cystic lesion one right posterior parietal region and another one left anterior medial parietal  Area. they measure 12 mm each  Neurological:     General: No focal deficit present.     Mental Status: She is alert and oriented to person,  place, and time.  Psychiatric:        Mood and Affect: Mood normal.        Behavior: Behavior normal.        Thought Content: Thought content normal.        Judgment: Judgment normal.      Assessment/Plan: 45 year old female status post robotic ventral hernia repair doing very well without complications.  In addition to that she now is being evaluated for 2 symptomatic scalp nodules.  He wishes to get them excised.  I do think that it would be reasonable to remove it in the office with local anesthetic.  Procedure discussed with the patient in detail.  Risks, benefits and possible complications.  She understands and wished to proceed we will schedule her in 2 to 3 weeks for excision of scalp lesions here in the office.  Please note that the scalp lesion evaluation and management was not  part of the global.  From her ventral hernia repair   Greater than 50% of the 20 minutes  visit was spent in counseling/coordination of care   Caroleen Hamman, MD [place] Surgeon

## 2020-10-30 ENCOUNTER — Encounter: Payer: Self-pay | Admitting: Gastroenterology

## 2020-11-02 ENCOUNTER — Other Ambulatory Visit: Payer: Self-pay

## 2020-11-02 ENCOUNTER — Ambulatory Visit: Payer: 59 | Admitting: Certified Registered Nurse Anesthetist

## 2020-11-02 ENCOUNTER — Ambulatory Visit
Admission: RE | Admit: 2020-11-02 | Discharge: 2020-11-02 | Disposition: A | Discharge status: Home / Self Care | Payer: 59 | Attending: Gastroenterology | Admitting: Gastroenterology

## 2020-11-02 ENCOUNTER — Encounter
Admission: RE | Disposition: A | Discharge status: Home / Self Care | Payer: Self-pay | Source: Home / Self Care | Attending: Gastroenterology

## 2020-11-02 ENCOUNTER — Encounter: Payer: Self-pay | Admitting: Gastroenterology

## 2020-11-02 DIAGNOSIS — Z975 Presence of (intrauterine) contraceptive device: Secondary | ICD-10-CM | POA: Insufficient documentation

## 2020-11-02 DIAGNOSIS — K3189 Other diseases of stomach and duodenum: Secondary | ICD-10-CM | POA: Insufficient documentation

## 2020-11-02 DIAGNOSIS — K644 Residual hemorrhoidal skin tags: Secondary | ICD-10-CM | POA: Insufficient documentation

## 2020-11-02 DIAGNOSIS — Z1211 Encounter for screening for malignant neoplasm of colon: Secondary | ICD-10-CM

## 2020-11-02 DIAGNOSIS — Z9049 Acquired absence of other specified parts of digestive tract: Secondary | ICD-10-CM | POA: Diagnosis not present

## 2020-11-02 DIAGNOSIS — R1310 Dysphagia, unspecified: Secondary | ICD-10-CM | POA: Diagnosis not present

## 2020-11-02 DIAGNOSIS — K2100 Gastro-esophageal reflux disease with esophagitis, without bleeding: Secondary | ICD-10-CM | POA: Diagnosis not present

## 2020-11-02 HISTORY — PX: COLONOSCOPY WITH PROPOFOL: SHX5780

## 2020-11-02 HISTORY — PX: ESOPHAGOGASTRODUODENOSCOPY (EGD) WITH PROPOFOL: SHX5813

## 2020-11-02 LAB — POCT PREGNANCY, URINE: Preg Test, Ur: NEGATIVE

## 2020-11-02 SURGERY — COLONOSCOPY WITH PROPOFOL
Anesthesia: General

## 2020-11-02 MED ORDER — PROPOFOL 10 MG/ML IV BOLUS
INTRAVENOUS | Status: DC | PRN
  Administered 2020-11-02: 40 mg via INTRAVENOUS
  Administered 2020-11-02: 30 mg via INTRAVENOUS
  Administered 2020-11-02: 70 mg via INTRAVENOUS

## 2020-11-02 MED ORDER — GLYCOPYRROLATE 0.2 MG/ML IJ SOLN
INTRAMUSCULAR | Status: DC | PRN
  Administered 2020-11-02: .2 mg via INTRAVENOUS

## 2020-11-02 MED ORDER — SODIUM CHLORIDE 0.9 % IV SOLN
INTRAVENOUS | Status: DC
  Administered 2020-11-02: 20 mL/h via INTRAVENOUS

## 2020-11-02 MED ORDER — DEXMEDETOMIDINE (PRECEDEX) IN NS 20 MCG/5ML (4 MCG/ML) IV SYRINGE
PREFILLED_SYRINGE | INTRAVENOUS | Status: DC | PRN
  Administered 2020-11-02 (×2): 4 ug via INTRAVENOUS

## 2020-11-02 MED ORDER — PROPOFOL 10 MG/ML IV BOLUS
INTRAVENOUS | Status: AC
  Filled 2020-11-02: qty 20

## 2020-11-02 MED ORDER — LIDOCAINE HCL (CARDIAC) PF 100 MG/5ML IV SOSY
PREFILLED_SYRINGE | INTRAVENOUS | Status: DC | PRN
  Administered 2020-11-02: 100 mg via INTRAVENOUS

## 2020-11-02 MED ORDER — PROPOFOL 500 MG/50ML IV EMUL
INTRAVENOUS | Status: DC | PRN
  Administered 2020-11-02: 140 ug/kg/min via INTRAVENOUS

## 2020-11-02 NOTE — Anesthesia Postprocedure Evaluation (Signed)
Anesthesia Post Note  Patient: Daeja Angelica Orlean Bradford  Procedure(s) Performed: COLONOSCOPY WITH PROPOFOL (N/A ) ESOPHAGOGASTRODUODENOSCOPY (EGD) WITH PROPOFOL (N/A )  Patient location during evaluation: Endoscopy Anesthesia Type: General Level of consciousness: awake and alert Pain management: pain level controlled Vital Signs Assessment: post-procedure vital signs reviewed and stable Respiratory status: spontaneous breathing, nonlabored ventilation, respiratory function stable and patient connected to nasal cannula oxygen Cardiovascular status: blood pressure returned to baseline and stable Postop Assessment: no apparent nausea or vomiting Anesthetic complications: no   No complications documented.   Last Vitals:  Vitals:   11/02/20 0951 11/02/20 1001  BP: 110/81 125/86  Pulse: (!) 56 (!) 55  Resp: 20 16  Temp:    SpO2: 99% 99%    Last Pain:  Vitals:   11/02/20 1001  TempSrc:   PainSc: 0-No pain                 Martha Clan

## 2020-11-02 NOTE — Op Note (Signed)
[facility] Gastroenterology Patient Name: Wanda Powers Procedure Date: 11/02/2020 8:30 AM MRN: [id number] Account #: [id number] Date of Birth: [birth year] Admit Type: Outpatient Age: 45 Room: [facility] ENDO ROOM 1 Gender: Female Note Status: Finalized Procedure:             Colonoscopy Indications:           Screening for colorectal malignant neoplasm, This is                         the patient's first colonoscopy Providers:             Lin Landsman MD, MD Medicines:             General Anesthesia Complications:         No immediate complications. Estimated blood loss: None. Procedure:             Pre-Anesthesia Assessment:                        - Prior to the procedure, a History and Physical was                         performed, and patient medications and allergies were                         reviewed. The patient is competent. The risks and                         benefits of the procedure and the sedation options and                         risks were discussed with the patient. All questions                         were answered and informed consent was obtained.                         Patient identification and proposed procedure were                         verified by the physician, the nurse, the                         anesthesiologist, the anesthetist and the technician                         in the pre-procedure area in the procedure room in the                         endoscopy suite. Mental Status Examination: alert and                         oriented. Airway Examination: normal oropharyngeal                         airway and neck mobility. Respiratory Examination:                         clear to auscultation. CV Examination: normal.  Prophylactic Antibiotics: The patient does not require                         prophylactic antibiotics. Prior Anticoagulants: The                         patient has taken no  previous anticoagulant or                         antiplatelet agents. ASA Grade Assessment: II - A                         patient with mild systemic disease. After reviewing                         the risks and benefits, the patient was deemed in                         satisfactory condition to undergo the procedure. The                         anesthesia plan was to use general anesthesia.                         Immediately prior to administration of medications,                         the patient was re-assessed for adequacy to receive                         sedatives. The heart rate, respiratory rate, oxygen                         saturations, blood pressure, adequacy of pulmonary                         ventilation, and response to care were monitored                         throughout the procedure. The physical status of the                         patient was re-assessed after the procedure.                        After obtaining informed consent, the colonoscope was                         passed under direct vision. Throughout the procedure,                         the patient's blood pressure, pulse, and oxygen                         saturations were monitored continuously. The                         Colonoscope was introduced through the anus and  advanced to the the cecum, identified by appendiceal                         orifice and ileocecal valve. The colonoscopy was                         performed without difficulty. The patient tolerated                         the procedure well. The quality of the bowel                         preparation was evaluated using the BBPS [facility] Bowel                         Preparation Scale) with scores of: Right Colon = 3,                         Transverse Colon = 3 and Left Colon = 3 (entire mucosa                         seen well with no residual staining, small fragments                         of  stool or opaque liquid). The total BBPS score                         equals 9. Findings:      Skin tags were found on perianal exam.      Non-bleeding external hemorrhoids were found during retroflexion. The       hemorrhoids were medium-sized.      The entire examined colon appeared normal. Impression:            - Perianal skin tags found on perianal exam.                        - Non-bleeding external hemorrhoids.                        - The entire examined colon is normal.                        - No specimens collected. Recommendation:        - Discharge patient to home (with escort).                        - Resume previous diet today.                        - Continue present medications.                        - Repeat colonoscopy in 10 years for screening                         purposes. Procedure Code(s):     --- Professional ---  T1572, Colorectal cancer screening; colonoscopy on                         individual not meeting criteria for high risk Diagnosis Code(s):     --- Professional ---                        K64.4, Residual hemorrhoidal skin tags                        Z12.11, Encounter for screening for malignant neoplasm                         of colon CPT copyright 2019 American Medical Association. All rights reserved. The codes documented in this report are preliminary and upon coder review may  be revised to meet current compliance requirements. Dr. Ulyess Mort Lin Landsman MD, MD 11/02/2020 9:28:27 AM This report has been signed electronically. Number of Addenda: 0 Note Initiated On: 11/02/2020 8:30 AM Scope Withdrawal Time: 0 hours 7 minutes 32 seconds  Total Procedure Duration: 0 hours 9 minutes 41 seconds  Estimated Blood Loss:  Estimated blood loss: none.      [facility]

## 2020-11-02 NOTE — Anesthesia Postprocedure Evaluation (Signed)
Anesthesia Post Note  Patient: Ellese Angelica Orlean Bradford  Procedure(s) Performed: COLONOSCOPY WITH PROPOFOL (N/A ) ESOPHAGOGASTRODUODENOSCOPY (EGD) WITH PROPOFOL (N/A )  Patient location during evaluation: Endoscopy Anesthesia Type: General Level of consciousness: awake and alert Pain management: pain level controlled Vital Signs Assessment: post-procedure vital signs reviewed and stable Respiratory status: spontaneous breathing, nonlabored ventilation, respiratory function stable and patient connected to nasal cannula oxygen Cardiovascular status: blood pressure returned to baseline and stable Postop Assessment: no apparent nausea or vomiting Anesthetic complications: no   No complications documented.   Last Vitals:  Vitals:   11/02/20 0951 11/02/20 1001  BP: 110/81 125/86  Pulse: (!) 56 (!) 55  Resp: 20 16  Temp:    SpO2: 99% 99%    Last Pain:  Vitals:   11/02/20 1001  TempSrc:   PainSc: 0-No pain                 Martha Clan

## 2020-11-02 NOTE — Anesthesia Procedure Notes (Signed)
Date/Time: 11/02/2020 9:03 AM Performed by: Lily Peer, Amyria Komar, CRNA Pre-anesthesia Checklist: Patient identified, Emergency Drugs available, Suction available, Patient being monitored and Timeout performed Patient Re-evaluated:Patient Re-evaluated prior to induction Oxygen Delivery Method: Nasal cannula Induction Type: IV induction

## 2020-11-02 NOTE — Transfer of Care (Signed)
Immediate Anesthesia Transfer of Care Note  Patient: Dorraine Angelica Orlean Bradford  Procedure(s) Performed: COLONOSCOPY WITH PROPOFOL (N/A ) ESOPHAGOGASTRODUODENOSCOPY (EGD) WITH PROPOFOL (N/A )  Patient Location: Endoscopy Unit  Anesthesia Type:General  Level of Consciousness: drowsy  Airway & Oxygen Therapy: Patient Spontanous Breathing  Post-op Assessment: Report given to RN and Post -op Vital signs reviewed and stable  Post vital signs: Reviewed and stable  Last Vitals:  Vitals Value Taken Time  BP 88/67 11/02/20 0931  Temp    Pulse 63 11/02/20 0931  Resp 18 11/02/20 0931  SpO2 98 % 11/02/20 0931    Last Pain:  Vitals:   11/02/20 0834  TempSrc: Temporal         Complications: No complications documented.

## 2020-11-02 NOTE — Op Note (Signed)
[facility] Patient Name: Wanda Powers Procedure Date: 11/02/2020 8:30 AM MRN: [id number] Account #: [id number] Date of Birth: 05-06-1976 Admit Type: Outpatient Age: 45 Room: [facility] ENDO ROOM 1 Gender: Female Note Status: Finalized Procedure:             Upper GI endoscopy Indications:           Dysphagia Providers:             Lin Landsman MD, MD Medicines:             General Anesthesia Complications:         No immediate complications. Estimated blood loss: None. Procedure:             Pre-Anesthesia Assessment:                        - Prior to the procedure, a History and Physical was                         performed, and patient medications and allergies were                         reviewed. The patient is competent. The risks and                         benefits of the procedure and the sedation options and                         risks were discussed with the patient. All questions                         were answered and informed consent was obtained.                         Patient identification and proposed procedure were                         verified by the physician, the nurse, the                         anesthesiologist, the anesthetist and the technician                         in the pre-procedure area in the procedure room in the                         endoscopy suite. Mental Status Examination: alert and                         oriented. Airway Examination: normal oropharyngeal                         airway and neck mobility. Respiratory Examination:                         clear to auscultation. CV Examination: normal.                         Prophylactic Antibiotics: The patient does not require  prophylactic antibiotics. Prior Anticoagulants: The                         patient has taken no previous anticoagulant or                         antiplatelet agents. ASA Grade  Assessment: II - A                         patient with mild systemic disease. After reviewing                         the risks and benefits, the patient was deemed in                         satisfactory condition to undergo the procedure. The                         anesthesia plan was to use general anesthesia.                         Immediately prior to administration of medications,                         the patient was re-assessed for adequacy to receive                         sedatives. The heart rate, respiratory rate, oxygen                         saturations, blood pressure, adequacy of pulmonary                         ventilation, and response to care were monitored                         throughout the procedure. The physical status of the                         patient was re-assessed after the procedure.                        After obtaining informed consent, the endoscope was                         passed under direct vision. Throughout the procedure,                         the patient's blood pressure, pulse, and oxygen                         saturations were monitored continuously. The Endoscope                         was introduced through the mouth, and advanced to the                         second part of duodenum. The upper GI endoscopy was  accomplished without difficulty. The patient tolerated                         the procedure well. Findings:      The gastroesophageal junction and examined esophagus were normal.       Biopsies were taken with a cold forceps for histology.      Esophagogastric landmarks were identified: the gastroesophageal junction       was found at 36 cm from the incisors.      Striped mildly erythematous mucosa without bleeding was found in the       gastric antrum. Biopsies were taken with a cold forceps for Helicobacter       pylori testing.      The gastric fundus, gastric body and incisura were  normal. Biopsies were       taken with a cold forceps for histology.      The examined duodenum was normal. Impression:            - Normal gastroesophageal junction and esophagus.                         Biopsied.                        - Esophagogastric landmarks identified.                        - Erythematous mucosa in the antrum. Biopsied.                        - Normal gastric fundus, gastric body and incisura.                         Biopsied.                        - Normal examined duodenum. Recommendation:        - Await pathology results.                        - Proceed with colonoscopy as scheduled                        See colonoscopy report Procedure Code(s):     --- Professional ---                        [phone], Esophagogastroduodenoscopy, flexible,                         transoral; with biopsy, single or multiple Diagnosis Code(s):     --- Professional ---                        K31.89, Other diseases of stomach and duodenum                        R13.10, Dysphagia, unspecified CPT copyright 2019 American Medical Association. All rights reserved. The codes documented in this report are preliminary and upon coder review may  be revised to meet current compliance requirements. Dr. Ulyess Mort Lin Landsman MD, MD 11/02/2020 9:14:25 AM This report has been signed electronically. Number of Addenda: 0 Note  Initiated On: 11/02/2020 8:30 AM Estimated Blood Loss:  Estimated blood loss: none.      [facility]

## 2020-11-02 NOTE — Anesthesia Preprocedure Evaluation (Signed)
Anesthesia Evaluation  Patient identified by MRN, date of birth, ID band Patient awake    Reviewed: Allergy & Precautions, H&P , NPO status , Patient's Chart, lab work & pertinent test results, reviewed documented beta blocker date and time   History of Anesthesia Complications Negative for: history of anesthetic complications  Airway Mallampati: II  TM Distance: >3 FB Neck ROM: full    Dental  (+) Partial Upper, Partial Lower, Caps, Dental Advidsory Given   Pulmonary neg pulmonary ROS,    Pulmonary exam normal breath sounds clear to auscultation       Cardiovascular Exercise Tolerance: Good negative cardio ROS Normal cardiovascular exam Rhythm:regular Rate:Normal     Neuro/Psych  Headaches, neg Seizures negative psych ROS   GI/Hepatic negative GI ROS, Neg liver ROS, GERD  Medicated and Controlled,On Rx for GERD x 5 days, states much improved. 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 10/21/8590: Biliary colic 03/19/4461: Chronic cholecystitis 10/17/2014: Common bile duct dilation 2013: Gestational diabetes No date: Headache 1996: Pyelonephritis Past Surgical History: 2004: CESAREAN SECTION     Comment:  x 1 No date: CHOLECYSTECTOMY 12/25/2014: LAPAROSCOPIC CHOLECYSTECTOMY SINGLE SITE WITH  INTRAOPERATIVE CHOLANGIOGRAM; N/A     Comment:  Procedure: LAPAROSCOPIC CHOLECYSTECTOMY SINGLE SITE WITH              INTRAOPERATIVE CHOLANGIOGRAM;  Surgeon: Michael Boston, MD;              Location: [facility] ORS;  Service: General;  Laterality: N/A; BMI    Body Mass Index: 27.46 kg/m     Reproductive/Obstetrics negative OB ROS                              Anesthesia Physical  Anesthesia Plan  ASA: II  Anesthesia Plan: General   Post-op Pain Management:    Induction: Intravenous  PONV Risk Score and Plan: TIVA and Propofol infusion  Airway Management Planned: Natural Airway and Nasal Cannula  Additional Equipment:   Intra-op Plan:   Post-operative Plan: Extubation in OR  Informed Consent: I have reviewed the patients History and Physical, chart, labs and discussed the procedure including the risks, benefits and alternatives for the proposed anesthesia with the patient or authorized representative who has indicated his/her understanding and acceptance.     Dental Advisory Given  Plan Discussed with: CRNA  Anesthesia Plan Comments:         Anesthesia Quick Evaluation

## 2020-11-02 NOTE — H&P (Signed)
Cephas Darby, MD [address]  [place]  [place], [place] [address]  Main: [phone]  Fax: [phone] Pager: [phone]  Primary Care Physician:  Trey Sailors, [place] Primary Gastroenterologist:  Dr. Cephas Darby  Pre-Procedure History & Physical: HPI:  Wanda Powers is a 45 y.o. female is here for an endoscopy and colonoscopy.   Past Medical History:  Diagnosis Date  . 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 Biliary colic 07/23/9676  . Chronic cholecystitis 12/25/2014  . Common bile duct dilation 10/17/2014  . Headache   . Pyelonephritis 1996    Past Surgical History:  Procedure Laterality Date  . CESAREAN SECTION  2004   x 1  . CHOLECYSTECTOMY    . INCISIONAL HERNIA REPAIR N/A 09/24/2020   Procedure: HERNIA REPAIR INCISIONAL umbilical;  Surgeon: Jules Husbands, MD;  Location: [facility] ORS;  Service: General;  Laterality: N/A;  . LAPAROSCOPIC CHOLECYSTECTOMY SINGLE SITE WITH INTRAOPERATIVE CHOLANGIOGRAM N/A 12/25/2014   Procedure: LAPAROSCOPIC CHOLECYSTECTOMY SINGLE SITE WITH INTRAOPERATIVE CHOLANGIOGRAM;  Surgeon: Michael Boston, MD;  Location: [facility] ORS;  Service: General;  Laterality: N/A;    Prior to Admission medications   Not on File    Allergies as of 09/16/2020  . (No Known Allergies)    Family History  Problem Relation Age of Onset  . Hyperlipidemia Mother   . Hypertension Mother   . Cancer Mother   . Heart attack Neg Hx   . Diabetes Neg Hx   . Sudden death Neg Hx     Social History   Socioeconomic History  . Marital status: Married    Spouse name: Not on file  . Number of children: Not on file  . Years of education: Not on file  . Highest education level: Not on file  Occupational History  . Not on file  Tobacco Use  . Smoking status: Never Smoker  . Smokeless tobacco: Never Used  Vaping Use  . Vaping Use: Never used  Substance and Sexual Activity  . Alcohol use: No  . Drug use: No  . Sexual activity: Yes    Birth  control/protection: I.U.D.  Other Topics Concern  . Not on file  Social History Narrative  . Not on file   Social Determinants of Health   Financial Resource Strain: Not on file  Food Insecurity: Not on file  Transportation Needs: Not on file  Physical Activity: Not on file  Stress: Not on file  Social Connections: Not on file  Intimate Partner Violence: Not on file    Review of Systems: See HPI, otherwise negative ROS  Physical Exam: Vitals:   11/02/20 0834 11/02/20 0931  BP: 127/82 (!) 88/67  Pulse: 67 63  Resp: 20 18  Temp: (!) 96.4 F (35.8 C)   SpO2: 100% 98%    General:   Alert,  pleasant and cooperative in NAD Head:  Normocephalic and atraumatic. Neck:  Supple; no masses or thyromegaly. Lungs:  Clear throughout to auscultation.    Heart:  Regular rate and rhythm. Abdomen:  Soft, nontender and nondistended. Normal bowel sounds, without guarding, and without rebound.   Neurologic:  Alert and  oriented x4;  grossly normal neurologically.  Impression/Plan: Wanda Powers is here for an endoscopy and colonoscopy to be performed for chronic heartburn, dysphagia, colon cancer screening  Risks, benefits, limitations, and alternatives regarding  endoscopy and colonoscopy have been reviewed with the patient.  Questions have been answered.  All parties agreeable.   Wanda Powers  Wanda Ditch, MD  11/02/2020, 9:33 AM

## 2020-11-03 ENCOUNTER — Encounter: Payer: Self-pay | Admitting: Gastroenterology

## 2020-11-03 LAB — SURGICAL PATHOLOGY

## 2020-11-16 ENCOUNTER — Ambulatory Visit: Payer: 59 | Admitting: Surgery

## 2020-11-25 ENCOUNTER — Other Ambulatory Visit: Payer: Self-pay | Admitting: Surgery

## 2020-11-25 ENCOUNTER — Other Ambulatory Visit: Payer: Self-pay

## 2020-11-25 ENCOUNTER — Encounter: Payer: Self-pay | Admitting: Surgery

## 2020-11-25 ENCOUNTER — Ambulatory Visit (INDEPENDENT_AMBULATORY_CARE_PROVIDER_SITE_OTHER): Payer: 59 | Admitting: Surgery

## 2020-11-25 VITALS — BP 115/80 | HR 71 | Temp 98.0°F | Ht 64.0 in | Wt 161.0 lb

## 2020-11-25 DIAGNOSIS — L7211 Pilar cyst: Secondary | ICD-10-CM

## 2020-11-25 DIAGNOSIS — L989 Disorder of the skin and subcutaneous tissue, unspecified: Secondary | ICD-10-CM

## 2020-11-25 NOTE — Patient Instructions (Addendum)
aplicar hielo en la zona varias veces al da. puede tomar Tylenol o ibuprofeno para cualquier dolor no te duches hasta el viernes. No frote las reas. el pegamento comenzar a desprenderse en 1-2 semanas.  Extirpacin de un quiste epidermoide, cuidados posteriores Epidermoid Cyst Removal, Care After Esta hoja le brinda informacin sobre cmo cuidarse despus del procedimiento. El mdico tambin podr darle instrucciones ms especficas. Comunquese con el mdico si tiene problemas o preguntas. Qu puedo esperar despus del procedimiento? Despus del procedimiento, es comn [employer] siguientes sntomas: Dolor en la zona donde se extirp el quiste. Tirantez o picazn en la piel donde estn los puntos (suturas). Siga estas instrucciones en su casa: Medicamentos Use los medicamentos de venta libre y los recetados solamente como se lo haya indicado el mdico. Si le recetaron un medicamento o ungento con antibitico, tmelo o aplqueselo como se lo haya indicado el mdico. No deje de usar el antibitico, aunque comience a Sports administrator. Cuidado de la incisin Siga las instrucciones del mdico acerca del cuidado de la incisin. Asegrese de hacer lo siguiente: Lvese las manos con agua y [place] durante al menos 20segundos antes de cambiar la venda (vendaje). Use desinfectante para manos si no dispone de [place] y [place]. Cambie el vendaje como se lo haya indicado el mdico. No retire las suturas, la goma para cerrar la piel o las tiras [place]. Es posible que estos cierres cutneos deban quedar puestos en la piel durante 1 o 2semanas, o ms tiempo. Si los bordes de las tiras adhesivas empiezan a despegarse y Therapist, sports, puede recortar los que estn sueltos. No retire las tiras [facility] por completo a menos que el mdico se lo indique. Mantenga el vendaje seco hasta que el mdico le diga que se lo puede quitar. Despus de quitarse el vendaje, controle la incisin todos los das para detectar signos  de infeccin. Est atento a los siguientes signos: Enrojecimiento, Land. Lquido o sangre. Calor. Pus o mal olor.   Indicaciones generales No tome baos de inmersin, no nade ni use el jacuzzi hasta que el mdico lo autorice. Pregntele al mdico si puede ducharse. Thurston Pounds solo le permitan darse baos de [place]. El mdico puede pedirle que evite los deportes de contacto o las actividades que requieran de mucho esfuerzo. No haga nada que estire u oprima la incisin. Puede volver a su dieta habitual. Cumpla con todas las visitas de seguimiento. Esto es importante. Comunquese con un mdico si: Tiene fiebre. La zona de la incisin est roja, se hincha o duele. Presenta lquido o sangre provenientes de la incisin. Tiene pus o percibe que sale mal olor de su incisin. Su incisin se siente caliente al tacto. El quiste le crece nuevamente. Solicite ayuda de inmediato si: El Environmental consultant de la incisin aumenta repentinamente de tamao y usted tiene Management consultant de la incisin. Es posible que se le examine para comprobar si se ha acumulado sangre debajo de la piel a causa del procedimiento (hematoma). Resumen Despus del procedimiento, es normal sentir dolor en la zona donde se extirp el quiste. Tome o aplquese los medicamentos de venta libre y los recetados solamente como se lo haya indicado el mdico. Siga las instrucciones del mdico acerca del cuidado de la incisin. Esta informacin no tiene Marine scientist el consejo del mdico. Asegrese de hacerle al mdico cualquier pregunta que tenga. Document Revised: 12/26/2019 Document Reviewed: 12/26/2019 Elsevier Patient Education  [place].

## 2020-11-25 NOTE — Progress Notes (Signed)
Procedure Note  Dx. Symptomatic scalp lesions  x 2  Procedures: 1. Excision of 14mm Right posterior parietal scalp nodule 2. Excision of 12 mm Left posterior temporal scalp nodule 3.  Intermediate closure of 24 mm wound scalp x 2 = 74mm  EBL 5cc  Anesthesia: lidocaine 1% w epi total 10cc  Patient was splinted but the risk-benefit of the procedure and consent was obtained.  Her hair was trimmed in the standard fashion and really tight and place the scalp in the standard fashion.  Elliptical incisions created incorporating the nodule.  Cautery was used to obtain hemostasis.  The wound was closed in a 2 layer fashion with 3-0 Vicryl for Monocryl skin and cover with Dermabond.  Attention then was turned to the second lesion where an Elliptical incisions created incorporating the nodule.  Cautery was used to obtain hemostasis.  The wound was closed in a 2 layer fashion with 3-0 Vicryl for Monocryl skin and cover with Dermabond. No complications

## 2020-12-01 ENCOUNTER — Telehealth: Payer: Self-pay

## 2020-12-01 NOTE — Telephone Encounter (Signed)
Pt notified of path from scalp showed benign cysts. Verbalizes understanding.

## 2020-12-16 ENCOUNTER — Ambulatory Visit: Payer: 59 | Admitting: Gastroenterology

## 2020-12-16 ENCOUNTER — Ambulatory Visit: Payer: 59 | Admitting: Surgery

## 2021-09-01 IMAGING — CR DG HIP (WITH OR WITHOUT PELVIS) 2-3V*L*
3 series · 3 of 3 positions shown · non-contrast
Comparison: None.

CLINICAL DATA: Left hip pain.

EXAM:
DG HIP (WITH OR WITHOUT PELVIS) 2-3V LEFT

[pelvis ap]
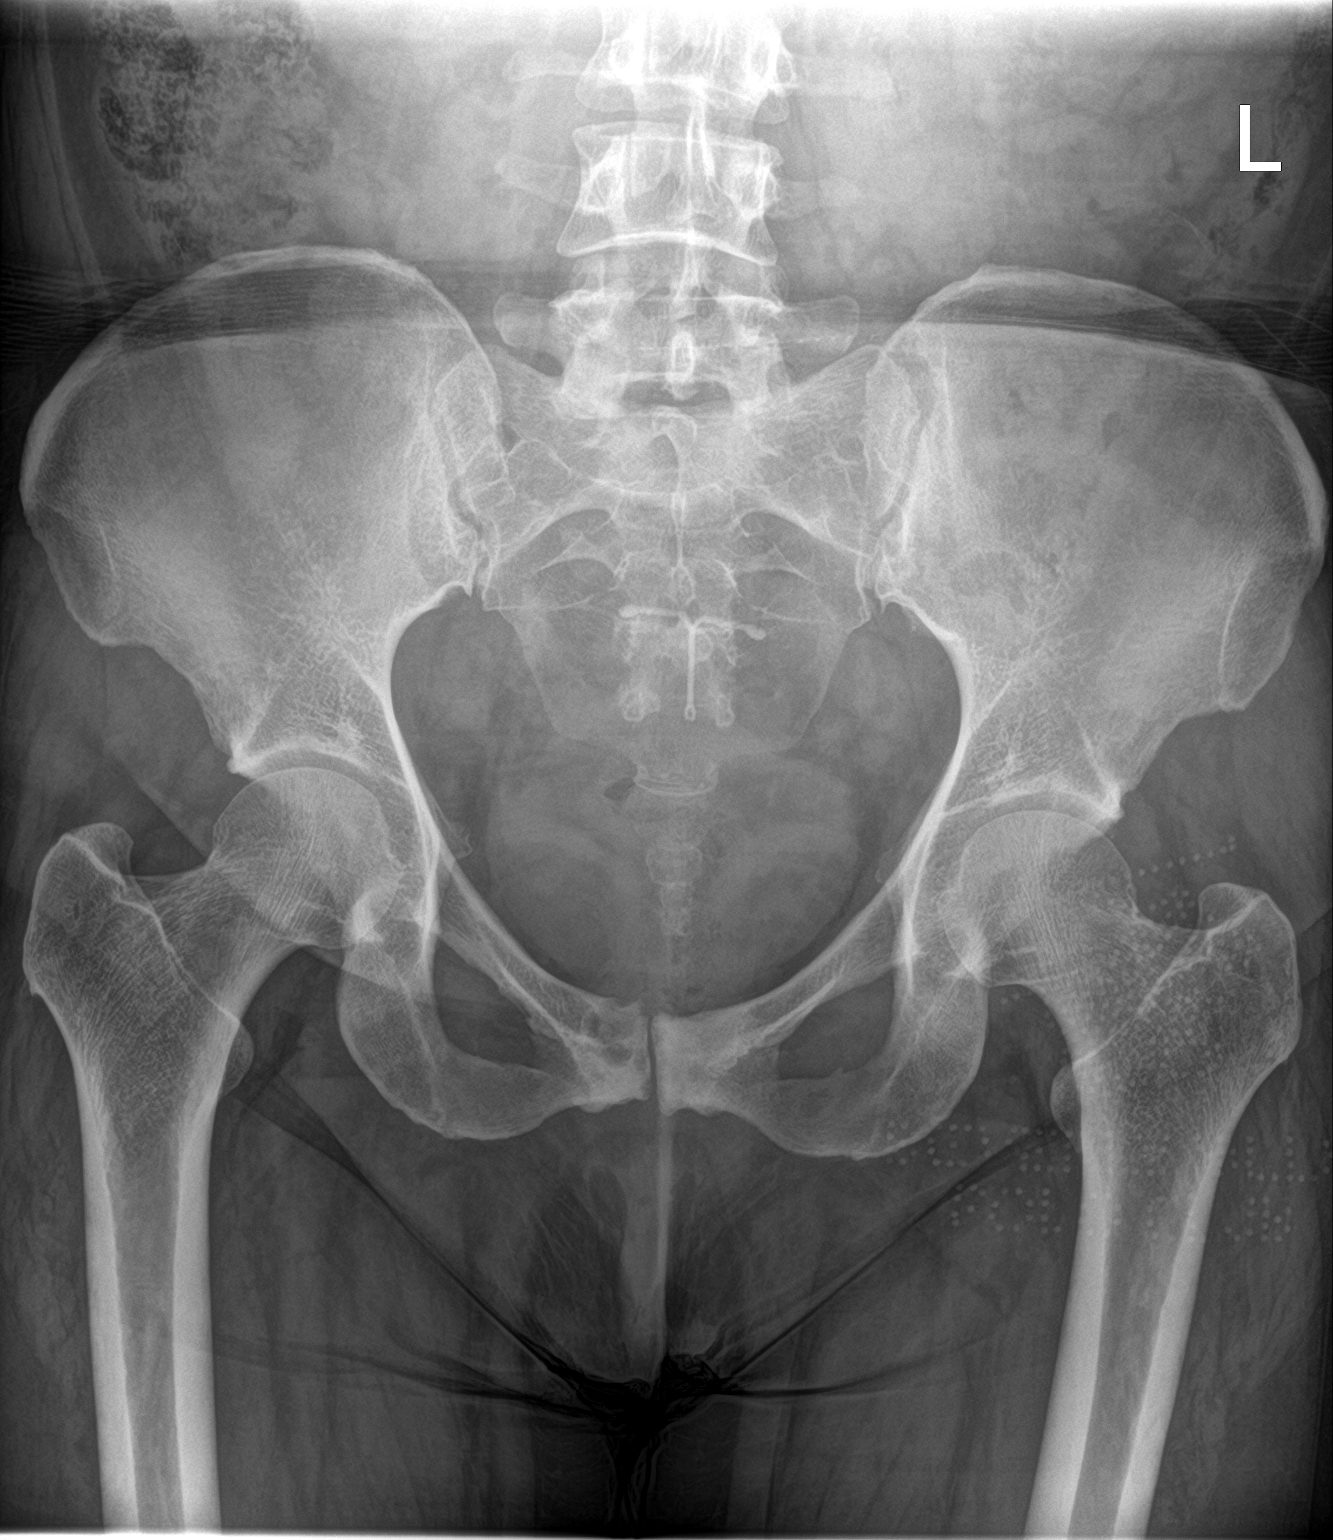

[hip ap]
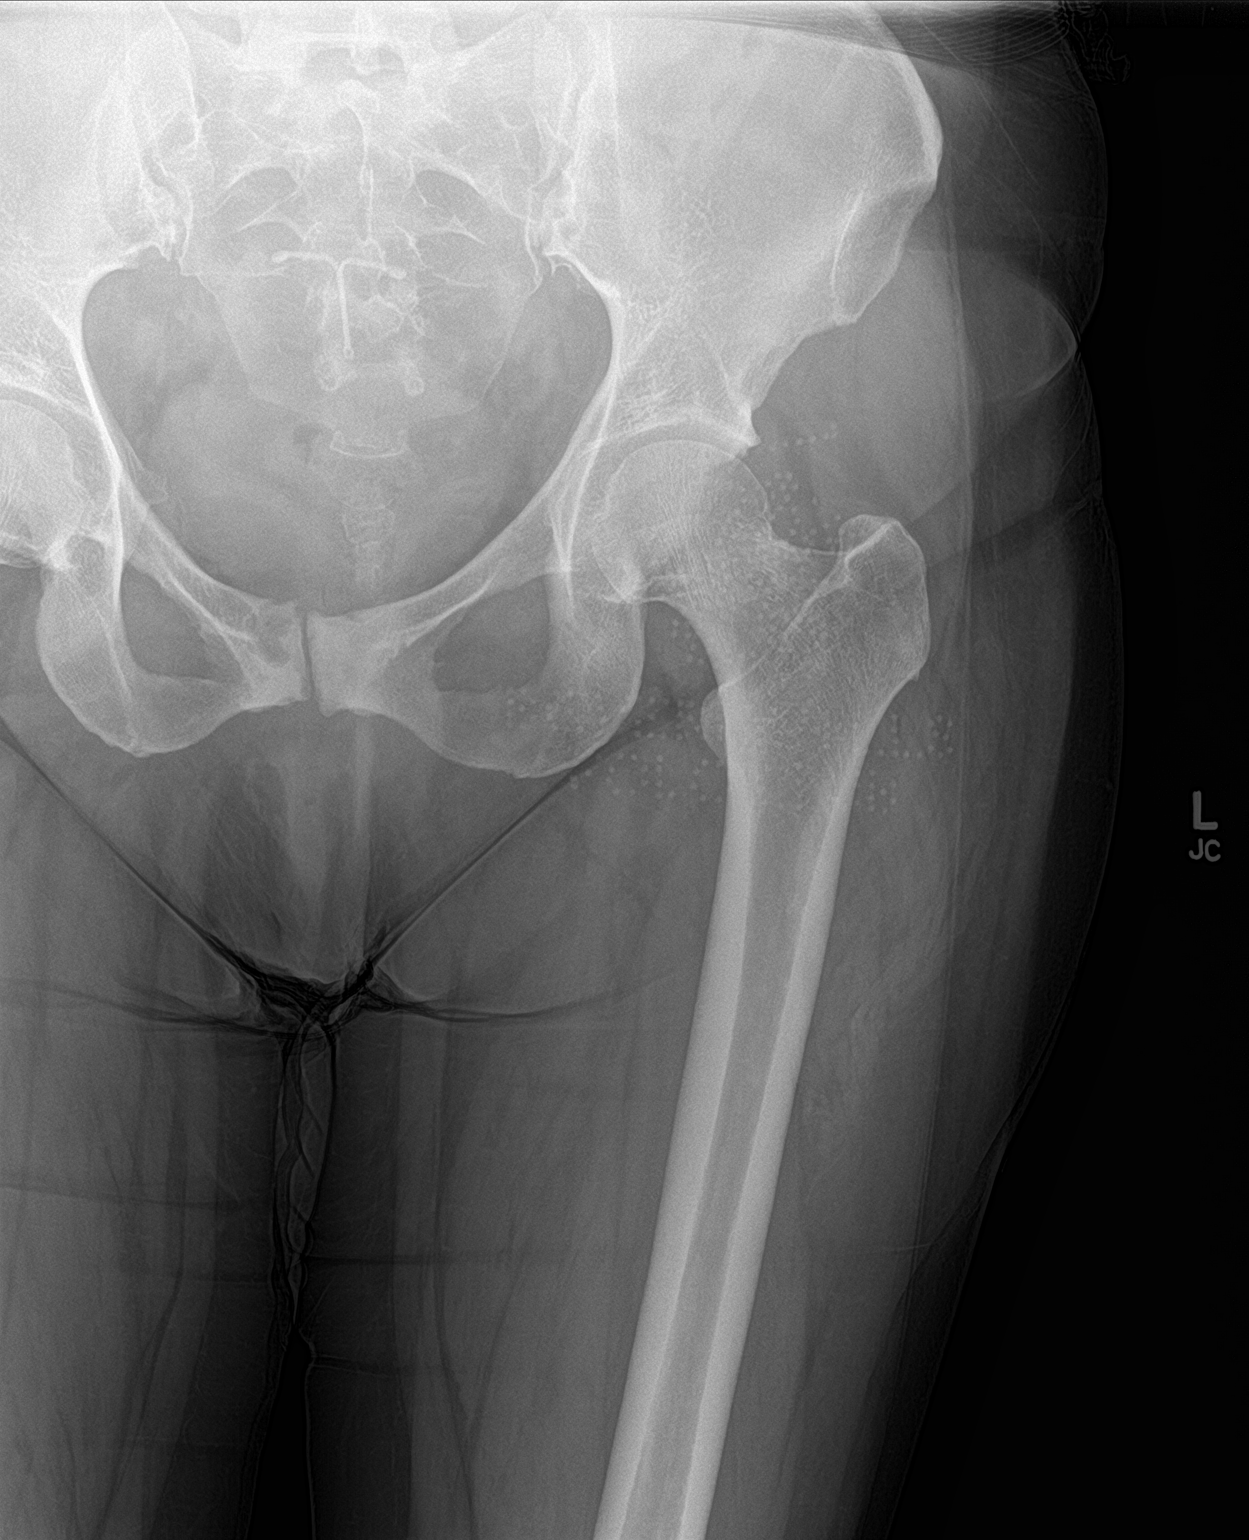

[hip lat]
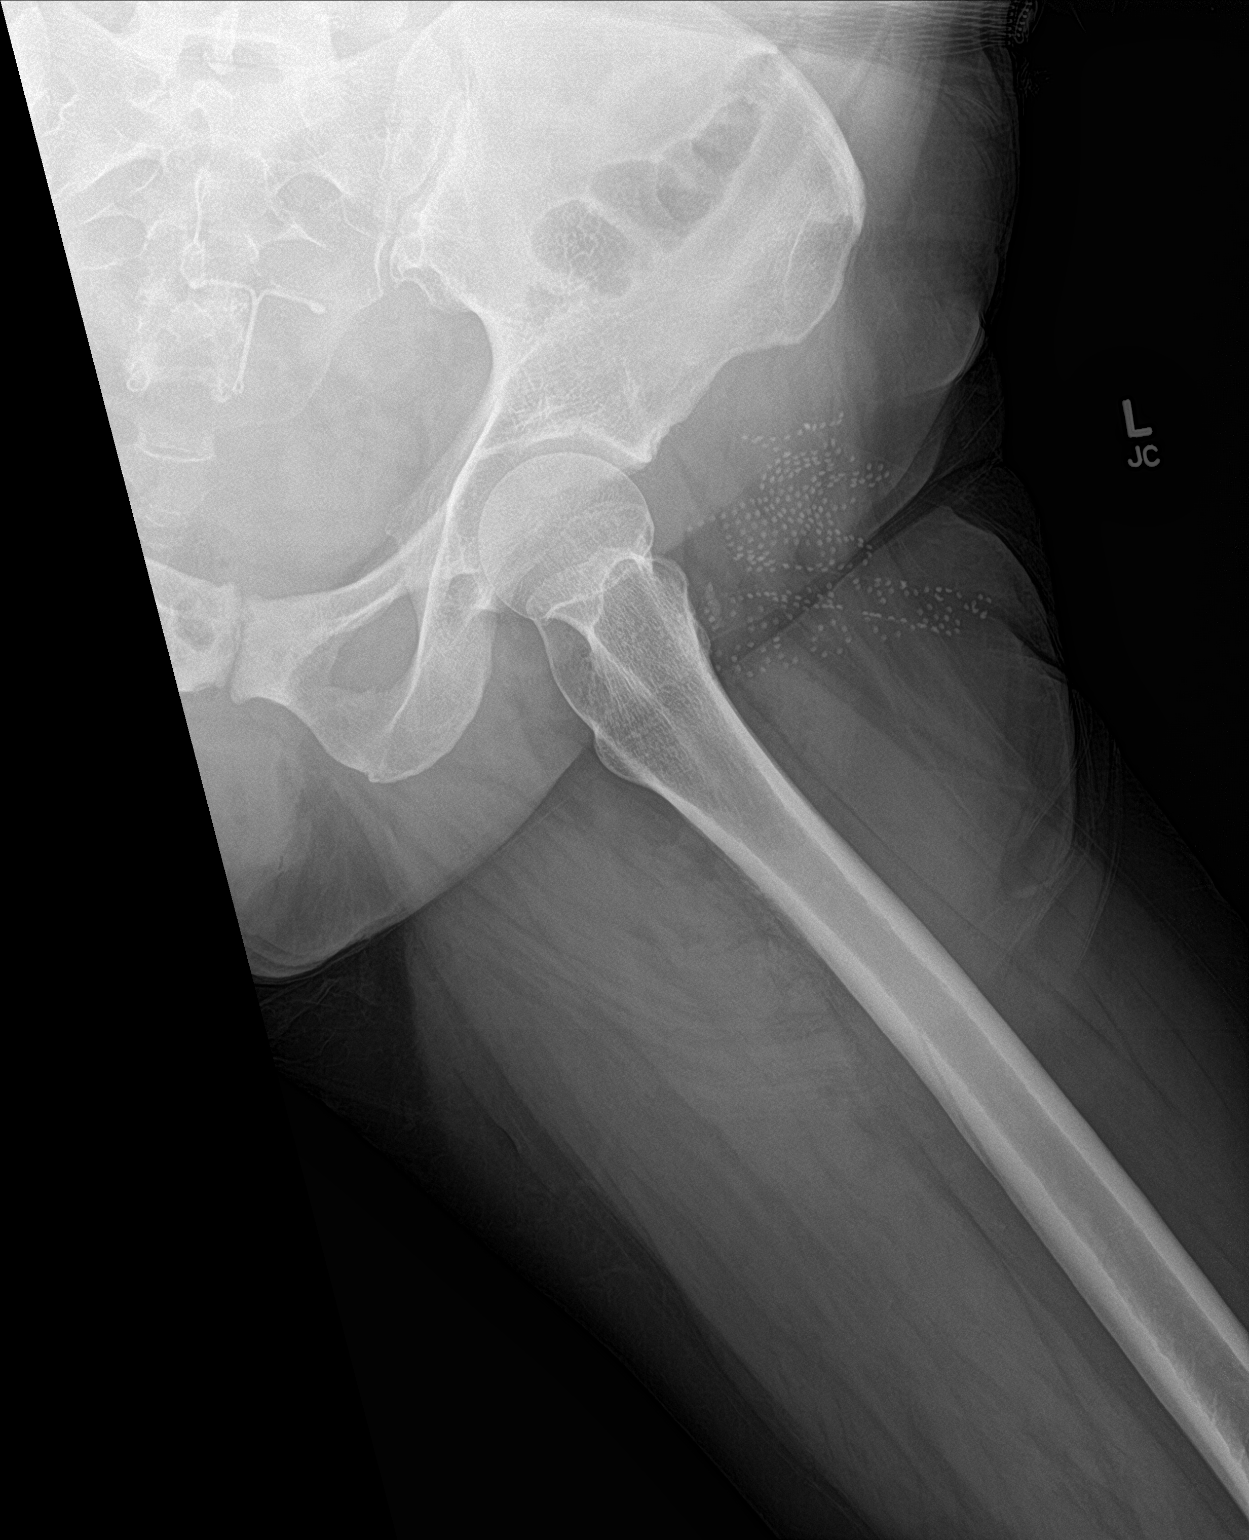

[3 of 3 positions shown; findings below may reference images not displayed]

FINDINGS: There is no evidence of hip fracture or dislocation. There is no
evidence of arthropathy or other focal bone abnormality.
IMPRESSION: Negative.

## 2022-04-18 ENCOUNTER — Telehealth: Payer: Self-pay

## 2022-04-18 NOTE — Telephone Encounter (Signed)
Copied from [place] [id number]. Topic: Referral - Status >> Apr 15, 2022 12:14 PM Everette C wrote: Reason for CRM: The patient has called to request an update on their previous requests for referrals for gynecology and behavioral health   Please contact further when possible   Not a patient of [facility]

## 2024-05-20 ENCOUNTER — Ambulatory Visit
Admission: EM | Admit: 2024-05-20 | Discharge: 2024-05-20 | Disposition: A | Discharge status: Home / Self Care | Attending: Family Medicine | Admitting: Family Medicine

## 2024-05-20 ENCOUNTER — Encounter: Payer: Self-pay | Admitting: Emergency Medicine

## 2024-05-20 DIAGNOSIS — R110 Nausea: Secondary | ICD-10-CM

## 2024-05-20 DIAGNOSIS — J40 Bronchitis, not specified as acute or chronic: Secondary | ICD-10-CM | POA: Diagnosis not present

## 2024-05-20 MED ORDER — PROMETHAZINE-DM 6.25-15 MG/5ML PO SYRP: 5.0000 mL | ORAL_SOLUTION | Freq: Four times a day (QID) | ORAL | 0 refills | Status: AC | PRN

## 2024-05-20 MED ORDER — AZITHROMYCIN 250 MG PO TABS: ORAL_TABLET | ORAL | 0 refills | Status: AC

## 2024-05-20 NOTE — Discharge Instructions (Signed)
 Take the antibiotic as directed.  2 pills today then 1 a day until gone Drink lots of water  I have refilled your promethazine  DM for cough  I have placed a referral to the gastroenterology specialist in [place].  The referral is [facility].  Their number is [phone]

## 2024-05-20 NOTE — ED Triage Notes (Signed)
 Pt presents to UC with c/o a cough, mid to low back pain, eye burning and chest discomfort with coughing x 6 days. Has only tried Tylenol  with no relief. Denies fever

## 2024-05-20 NOTE — ED Provider Notes (Signed)
 [facility]    CSN: [id number] Arrival date & time: 05/20/24  1003      History   Chief Complaint Chief Complaint  Patient presents with   Cough   Nasal Congestion   Back Pain    HPI Wanda Powers is a 48 y.o. female.   HPI Patient speaks English well.  Declines need for interpreter Patient states that she has had a cough, congestion, pain around her low back, fatigue and some sweats for the last 4 days.  No fever.  No recent travel.  No known exposure to illness.  No underlying lung disease, cigarette smoking, or asthma A second problem is unremitting nausea.  She states she has had it for the last 4 months.  She states it is present every day.  It has not improved with over-the-counter medication.  She does not drink alcohol or take NSAID drugs.  States she does not have a history of GERD or ulcers.  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   Biliary colic 10/17/2014   Chronic cholecystitis 12/25/2014   Common bile duct dilation 10/17/2014   Headache    Pyelonephritis 1996    Patient Active Problem List   Diagnosis Date Noted   Dysphagia    Screening for colon cancer    Mixed hyperlipidemia 07/27/2020   Other seasonal allergic rhinitis 07/27/2020   Skin sensation disturbance 07/27/2020   Vitamin D deficiency 07/27/2020   Pruritus 02/28/2018   Tension headache 09/09/2014   Hypopigmented skin lesion 08/02/2013   History of cesarean delivery 07/02/2012    Past Surgical History:  Procedure Laterality Date   CESAREAN SECTION  2004   x 1   CHOLECYSTECTOMY     COLONOSCOPY WITH PROPOFOL  N/A 11/02/2020   Procedure: COLONOSCOPY WITH PROPOFOL ;  Surgeon: Unk Corinn Skiff, MD;  Location: [facility];  Service: Gastroenterology;  Laterality: N/A;   ESOPHAGOGASTRODUODENOSCOPY (EGD) WITH PROPOFOL  N/A 11/02/2020   Procedure: ESOPHAGOGASTRODUODENOSCOPY (EGD) WITH PROPOFOL ;  Surgeon:  Unk Corinn Skiff, MD;  Location: [facility];  Service: Gastroenterology;  Laterality: N/A;   INCISIONAL HERNIA REPAIR N/A 09/24/2020   Procedure: HERNIA REPAIR INCISIONAL umbilical;  Surgeon: Jordis Laneta FALCON, MD;  Location: [facility] ORS;  Service: General;  Laterality: N/A;   LAPAROSCOPIC CHOLECYSTECTOMY SINGLE SITE WITH INTRAOPERATIVE CHOLANGIOGRAM N/A 12/25/2014   Procedure: LAPAROSCOPIC CHOLECYSTECTOMY SINGLE SITE WITH INTRAOPERATIVE CHOLANGIOGRAM;  Surgeon: Elspeth Schultze, MD;  Location: [facility];  Service: General;  Laterality: N/A;    OB History     Gravida  4   Para  3   Term  3   Preterm      AB      Living  3      SAB      IAB      Ectopic      Multiple      Live Births               Home Medications    Prior to Admission medications   Medication Sig Start Date End Date Taking? Authorizing Provider  azithromycin (ZITHROMAX Z-PAK) 250 MG tablet Take 2 pills right away.  After this take 1 pill a day until gone 05/20/24  Yes Maranda Jamee Jacob, MD  promethazine -dextromethorphan (PROMETHAZINE -DM) 6.25-15 MG/5ML syrup Take 5 mLs by mouth 4 (four) times daily as needed for cough. 05/20/24  Yes Maranda Jamee Jacob, MD  cetirizine  (ZYRTEC ) 10 MG tablet Take 10  mg by mouth daily.    [provider]  Multiple Vitamin tablet Take 1 tablet by mouth daily.    [provider]    Family History Family History  Problem Relation Age of Onset   Hyperlipidemia Mother    Hypertension Mother    Cancer Mother    Heart attack Neg Hx    Diabetes Neg Hx    Sudden death Neg Hx     Social History Social History   Tobacco Use   Smoking status: Never   Smokeless tobacco: Never  Vaping Use   Vaping status: Never Used  Substance Use Topics   Alcohol use: No   Drug use: No     Allergies   Patient has no known allergies.   Review of Systems Review of Systems See HPI  Physical Exam Triage Vital Signs ED Triage Vitals  Encounter Vitals Group      BP 05/20/24 1021 132/86     Girls Systolic BP Percentile --      Girls Diastolic BP Percentile --      Boys Systolic BP Percentile --      Boys Diastolic BP Percentile --      Pulse Rate 05/20/24 1021 82     Resp 05/20/24 1021 16     Temp 05/20/24 1021 97.9 F (36.6 C)     Temp Source 05/20/24 1021 Oral     SpO2 05/20/24 1021 98 %     Weight --      Height --      Head Circumference --      Peak Flow --      Pain Score 05/20/24 1018 7     Pain Loc --      Pain Education --      Exclude from Growth Chart --    No data found.  Updated Vital Signs BP 132/86 (BP Location: Right Arm)   Pulse 82   Temp 97.9 F (36.6 C) (Oral)   Resp 16   SpO2 98%      Physical Exam Constitutional:      General: She is not in acute distress.    Appearance: She is well-developed and normal weight.  HENT:     Head: Normocephalic and atraumatic.     Right Ear: Tympanic membrane normal.     Left Ear: Tympanic membrane normal.     Nose: Nose normal.     Mouth/Throat:     Mouth: Mucous membranes are moist.     Pharynx: No posterior oropharyngeal erythema.  Eyes:     Conjunctiva/sclera: Conjunctivae normal.     Pupils: Pupils are equal, round, and reactive to light.  Cardiovascular:     Rate and Rhythm: Normal rate and regular rhythm.     Heart sounds: Normal heart sounds.  Pulmonary:     Effort: Pulmonary effort is normal. No respiratory distress.     Breath sounds: Normal breath sounds.  Abdominal:     General: There is no distension.     Palpations: Abdomen is soft.     Tenderness: There is no abdominal tenderness.  Musculoskeletal:        General: Normal range of motion.     Cervical back: Normal range of motion and neck supple.  Skin:    General: Skin is warm and dry.  Neurological:     Mental Status: She is alert.      UC Treatments / Results  Labs (all labs ordered are listed, but only  abnormal results are displayed) Labs Reviewed - No data to  display  EKG   Radiology No results found.  Procedures Procedures (including critical care time)  Medications Ordered in UC Medications - No data to display  Initial Impression / Assessment and Plan / UC Course  I have reviewed the triage vital signs and the nursing notes.  Pertinent labs & imaging results that were available during my care of the patient were reviewed by me and considered in my medical decision making (see chart for details).     Patient has had symptoms for 6 days.  Viral testing not done.  Because of her failure to improve and worsening symptoms I will give her a Z-Pak.  Request a refill of Promethazine  DM which she had used for cough. Referral for gastroenterology was placed.  She is encouraged to see her PCP in follow-up Final Clinical Impressions(s) / UC Diagnoses   Final diagnoses:  Bronchitis  Intractable nausea     Discharge Instructions      Take the antibiotic as directed.  2 pills today then 1 a day until gone Drink lots of water  I have refilled your promethazine  DM for cough  I have placed a referral to the gastroenterology specialist in [place].  The referral is [facility].  Their number is [phone]   ED Prescriptions     Medication Sig Dispense Auth. Provider   azithromycin (ZITHROMAX Z-PAK) 250 MG tablet Take 2 pills right away.  After this take 1 pill a day until gone 6 tablet Maranda Jamee Jacob, MD   promethazine -dextromethorphan (PROMETHAZINE -DM) 6.25-15 MG/5ML syrup Take 5 mLs by mouth 4 (four) times daily as needed for cough. 118 mL Maranda Jamee Jacob, MD      PDMP not reviewed this encounter.   Maranda Jamee Jacob, MD 05/20/24 [phone]

## 2024-06-24 ENCOUNTER — Encounter: Admitting: Obstetrics & Gynecology

## 2024-08-05 ENCOUNTER — Emergency Department (HOSPITAL_COMMUNITY)
Admission: EM | Admit: 2024-08-05 | Discharge: 2024-08-05 | Disposition: A | Discharge status: Home / Self Care | Attending: Emergency Medicine | Admitting: Emergency Medicine

## 2024-08-05 ENCOUNTER — Other Ambulatory Visit: Payer: Self-pay

## 2024-08-05 ENCOUNTER — Emergency Department (HOSPITAL_COMMUNITY)

## 2024-08-05 ENCOUNTER — Encounter (HOSPITAL_COMMUNITY): Payer: Self-pay

## 2024-08-05 DIAGNOSIS — R1031 Right lower quadrant pain: Secondary | ICD-10-CM | POA: Diagnosis present

## 2024-08-05 DIAGNOSIS — N83201 Unspecified ovarian cyst, right side: Secondary | ICD-10-CM | POA: Diagnosis not present

## 2024-08-05 DIAGNOSIS — K6289 Other specified diseases of anus and rectum: Secondary | ICD-10-CM

## 2024-08-05 LAB — URINALYSIS, ROUTINE W REFLEX MICROSCOPIC
Bacteria, UA: NONE SEEN
Bilirubin Urine: NEGATIVE
Glucose, UA: 150 mg/dL — AB
Ketones, ur: NEGATIVE mg/dL
Leukocytes,Ua: NEGATIVE
Nitrite: NEGATIVE
Protein, ur: NEGATIVE mg/dL
Specific Gravity, Urine: 1.018 (ref 1.005–1.030)
pH: 6 (ref 5.0–8.0)

## 2024-08-05 LAB — COMPREHENSIVE METABOLIC PANEL WITH GFR
ALT: 21 U/L (ref 0–44)
AST: 27 U/L (ref 15–41)
Albumin: 4.1 g/dL (ref 3.5–5.0)
Alkaline Phosphatase: 102 U/L (ref 38–126)
Anion gap: 11 (ref 5–15)
BUN: 14 mg/dL (ref 6–20)
CO2: 25 mmol/L (ref 22–32)
Calcium: 9 mg/dL (ref 8.9–10.3)
Chloride: 101 mmol/L (ref 98–111)
Creatinine, Ser: 0.49 mg/dL (ref 0.44–1.00)
GFR, Estimated: >60 mL/min
Glucose, Bld: 180 mg/dL — ABNORMAL HIGH (ref 70–99)
Potassium: 4.2 mmol/L (ref 3.5–5.1)
Sodium: 137 mmol/L (ref 135–145)
Total Bilirubin: 0.2 mg/dL (ref 0.0–1.2)
Total Protein: 7.1 g/dL (ref 6.5–8.1)

## 2024-08-05 LAB — CBC
HCT: 40.9 % (ref 36.0–46.0)
Hemoglobin: 14.2 g/dL (ref 12.0–15.0)
MCH: 28.5 pg (ref 26.0–34.0)
MCHC: 34.7 g/dL (ref 30.0–36.0)
MCV: 82.1 fL (ref 80.0–100.0)
Platelets: 202 K/uL (ref 150–400)
RBC: 4.98 MIL/uL (ref 3.87–5.11)
RDW: 13.2 % (ref 11.5–15.5)
WBC: 3.8 K/uL — ABNORMAL LOW (ref 4.0–10.5)
nRBC: 0 % (ref 0.0–0.2)

## 2024-08-05 LAB — LIPASE, BLOOD: Lipase: 30 U/L (ref 11–51)

## 2024-08-05 LAB — HCG, SERUM, QUALITATIVE: Preg, Serum: NEGATIVE

## 2024-08-05 MED ORDER — HYDROCORTISONE ACETATE 25 MG RE SUPP: 25.0000 mg | Freq: Two times a day (BID) | RECTAL | 0 refills | Status: AC

## 2024-08-05 MED ORDER — DOCUSATE SODIUM 100 MG PO CAPS: 100.0000 mg | ORAL_CAPSULE | Freq: Every day | ORAL | 0 refills | Status: AC

## 2024-08-05 MED ORDER — KETOROLAC TROMETHAMINE 30 MG/ML IJ SOLN
15.0000 mg | Freq: Once | INTRAMUSCULAR | Status: AC
  Administered 2024-08-05: 15 mg via INTRAVENOUS
  Filled 2024-08-05: qty 1

## 2024-08-05 MED ORDER — IBUPROFEN 800 MG PO TABS: 800.0000 mg | ORAL_TABLET | Freq: Four times a day (QID) | ORAL | 0 refills | Status: AC | PRN

## 2024-08-05 MED ORDER — IOHEXOL 350 MG/ML SOLN
75.0000 mL | Freq: Once | INTRAVENOUS | Status: AC | PRN
  Administered 2024-08-05: 75 mL via INTRAVENOUS

## 2024-08-05 NOTE — ED Triage Notes (Signed)
 Complaining of pain in the lower abdomen that started back in September. For the last 3 weeks it has been more intense. [place] it is in both lower sides of the abdomen and radiates to the back. She also said that she has some discharge.

## 2024-08-05 NOTE — ED Provider Notes (Signed)
 " [place] [facility] Provider Note   CSN: [id number] Arrival date & time: 08/05/24  0025     Patient presents with: Abdominal Pain   Wanda Powers is a 49 y.o. female.   Presents to the emerged part for evaluation of abdominal pain.  Patient reports that she has been having some chronic pain issues for months but in the last couple of weeks pain has gotten worse.  Pain is mostly in the right lower abdomen radiating around into the right back.  She also has some rectal discomfort as well.  There is no diarrhea, constipation, rectal bleeding.       Prior to Admission medications  Medication Sig Start Date End Date Taking? Authorizing Provider  ibuprofen  (ADVIL ) 800 MG tablet Take 1 tablet (800 mg total) by mouth every 6 (six) hours as needed for moderate pain (pain score 4-6). 08/05/24  Yes Brekyn Huntoon, Lonni PARAS, MD  azithromycin  (ZITHROMAX  Z-PAK) 250 MG tablet Take 2 pills right away.  After this take 1 pill a day until gone 05/20/24   Maranda Jamee Jacob, MD  cetirizine  (ZYRTEC ) 10 MG tablet Take 10 mg by mouth daily.    [provider]  Multiple Vitamin tablet Take 1 tablet by mouth daily.    [provider]  promethazine -dextromethorphan (PROMETHAZINE -DM) 6.25-15 MG/5ML syrup Take 5 mLs by mouth 4 (four) times daily as needed for cough. 05/20/24   Maranda Jamee Jacob, MD    Allergies: Patient has no known allergies.    Review of Systems  Updated Vital Signs BP (!) 148/78 (BP Location: Right Arm)   Pulse 85   Temp 98.4 F (36.9 C)   Resp 16   Ht 5' 4 (1.626 m)   Wt 73 kg   SpO2 98%   BMI 27.64 kg/m   Physical Exam Vitals and nursing note reviewed.  Constitutional:      General: She is not in acute distress.    Appearance: She is well-developed.  HENT:     Head: Normocephalic and atraumatic.     Mouth/Throat:     Mouth: Mucous membranes are moist.  Eyes:     General: Vision grossly intact.  Gaze aligned appropriately.     Extraocular Movements: Extraocular movements intact.     Conjunctiva/sclera: Conjunctivae normal.  Cardiovascular:     Rate and Rhythm: Normal rate and regular rhythm.     Pulses: Normal pulses.     Heart sounds: Normal heart sounds, S1 normal and S2 normal. No murmur heard.    No friction rub. No gallop.  Pulmonary:     Effort: Pulmonary effort is normal. No respiratory distress.     Breath sounds: Normal breath sounds.  Abdominal:     General: Bowel sounds are normal.     Palpations: Abdomen is soft.     Tenderness: There is abdominal tenderness in the right lower quadrant. There is no guarding or rebound.     Hernia: No hernia is present.  Musculoskeletal:        General: No swelling.     Cervical back: Full passive range of motion without pain, normal range of motion and neck supple. No spinous process tenderness or muscular tenderness. Normal range of motion.     Right lower leg: No edema.     Left lower leg: No edema.  Skin:    General: Skin is warm and dry.     Capillary Refill: Capillary refill takes less than 2  seconds.     Findings: No ecchymosis, erythema, rash or wound.  Neurological:     General: No focal deficit present.     Mental Status: She is alert and oriented to person, place, and time.     GCS: GCS eye subscore is 4. GCS verbal subscore is 5. GCS motor subscore is 6.     Cranial Nerves: Cranial nerves 2-12 are intact.     Sensory: Sensation is intact.     Motor: Motor function is intact.     Coordination: Coordination is intact.  Psychiatric:        Attention and Perception: Attention normal.        Mood and Affect: Mood normal.        Speech: Speech normal.        Behavior: Behavior normal.     (all labs ordered are listed, but only abnormal results are displayed) Labs Reviewed  COMPREHENSIVE METABOLIC PANEL WITH GFR - Abnormal; Notable for the following components:      Result Value   Glucose, Bld 180 (*)    All  other components within normal limits  CBC - Abnormal; Notable for the following components:   WBC 3.8 (*)    All other components within normal limits  URINALYSIS, ROUTINE W REFLEX MICROSCOPIC - Abnormal; Notable for the following components:   Glucose, UA 150 (*)    Hgb urine dipstick SMALL (*)    All other components within normal limits  LIPASE, BLOOD  HCG, SERUM, QUALITATIVE    EKG: None  Radiology: CT ABDOMEN PELVIS W CONTRAST Result Date: 08/05/2024 EXAM: CT ABDOMEN AND PELVIS WITH CONTRAST 08/05/2024 02:22:00 AM TECHNIQUE: CT of the abdomen and pelvis was performed with the administration of 75 mL of iohexol  (OMNIPAQUE ) 350 MG/ML injection. Multiplanar reformatted images are provided for review. Automated exposure control, iterative reconstruction, and/or weight-based adjustment of the mA/kV was utilized to reduce the radiation dose to as low as reasonably achievable. COMPARISON: Comparison study 08/17/2020. CLINICAL HISTORY: Acute abdominal pain. FINDINGS: LOWER CHEST: No acute abnormality. LIVER: The liver is unremarkable. GALLBLADDER AND BILE DUCTS: Gallbladder has been surgically removed. No calculi or obstructive changes are noted. No biliary ductal dilatation. SPLEEN: No acute abnormality. PANCREAS: No acute abnormality. ADRENAL GLANDS: No acute abnormality. KIDNEYS, URETERS AND BLADDER: No stones in the kidneys or ureters. No hydronephrosis. No perinephric or periureteral stranding. The bladder is partially distended. GI AND BOWEL: The stomach is unremarkable. Scattered large and small bowel gas is noted. The appendix is within normal limits. There is no bowel obstruction. PERITONEUM AND RETROPERITONEUM: No ascites. No free air. VASCULATURE: Aorta is normal in caliber. LYMPH NODES: No lymphadenopathy. REPRODUCTIVE ORGANS: The uterus is well visualized with IUD in place A simple right ovarian cyst is noted, measuring up to 5.7 cm. This is new from the prior exam. BONES AND SOFT  TISSUES: No acute osseous abnormality. No focal soft tissue abnormality. IMPRESSION: 1. No acute findings in the abdomen or pelvis. 2. New simple-appearing right ovarian cyst measuring up to 5.7 cm, recommend follow-up ultrasound in 3-6 months. Electronically signed by: Oneil Devonshire MD 08/05/2024 02:40 AM EST RP Workstation: [id number]     Procedures   Medications Ordered in the ED  ketorolac  (TORADOL ) 30 MG/ML injection 15 mg (has no administration in time range)  iohexol  (OMNIPAQUE ) 350 MG/ML injection 75 mL (75 mLs Intravenous Contrast Given 08/05/24 0227)  Medical Decision Making Amount and/or Complexity of Data Reviewed Labs: ordered. Decision-making details documented in ED Course. Radiology: ordered and independent interpretation performed. Decision-making details documented in ED Course.  Risk Prescription drug management.   Differential Diagnosis considered includes, but not limited to: Appendicitis; colitis; diverticulitis; bowel obstruction; cystitis; nephrolithiasis; pyelonephritis; ovarian cyst, ovarian torsion, PID, ectopic pregnancy.  Presents to the emergency department for evaluation of lower abdominal/pelvic pain.  Symptoms ongoing for several months.  Over the last few weeks, pain has worsened.  Pain going into her back as well as the right side of the abdomen, having some rectal discomfort as well.  Abdominal exam reveals tenderness in the right lower quadrant, no guarding, signs of peritonitis.  Lab work was normal.  Patient underwent CT scan to further evaluate.  Patient does have a simple appearing cyst in the right ovary which may be causing some of her symptoms.  Doubt torsion based on the indolent course of this.  No other pathology to explain rectal pain/abdominal pain.  Treat with analgesia and refer to OB/GYN for further evaluation of this cyst.     Final diagnoses:  Right lower quadrant abdominal pain  Cyst of right ovary     ED Discharge Orders          Ordered    ibuprofen  (ADVIL ) 800 MG tablet  Every 6 hours PRN        08/05/24 0312               Haze Lonni PARAS, MD 08/05/24 [phone]  "
